# Patient Record
Sex: Female | Born: 1960 | Race: White | Hispanic: No | Smoking: Current every day smoker
Health system: Southern US, Community
[De-identification: ages and names within clinical notes are randomized; demographics above are authoritative.]

## PROBLEM LIST (undated history)

## (undated) DIAGNOSIS — J449 Chronic obstructive pulmonary disease, unspecified: Secondary | ICD-10-CM

## (undated) DIAGNOSIS — I10 Essential (primary) hypertension: Secondary | ICD-10-CM

## (undated) DIAGNOSIS — E1169 Type 2 diabetes mellitus with other specified complication: Secondary | ICD-10-CM

## (undated) DIAGNOSIS — G4733 Obstructive sleep apnea (adult) (pediatric): Secondary | ICD-10-CM

## (undated) DIAGNOSIS — E782 Mixed hyperlipidemia: Secondary | ICD-10-CM

## (undated) DIAGNOSIS — I700 Atherosclerosis of aorta: Secondary | ICD-10-CM

## (undated) HISTORY — DX: Obstructive sleep apnea (adult) (pediatric): G47.33

## (undated) HISTORY — DX: Essential (primary) hypertension: I10

## (undated) HISTORY — DX: Type 2 diabetes mellitus with other specified complication: E11.69

## (undated) HISTORY — DX: Mixed hyperlipidemia: E78.2

## (undated) HISTORY — DX: Chronic obstructive pulmonary disease, unspecified: J44.9

## (undated) HISTORY — DX: Atherosclerosis of aorta: I70.0

## (undated) HISTORY — PX: NO PAST SURGERIES: SHX2092

---

## 2013-02-03 LAB — HM PAP SMEAR: HM Pap smear: NORMAL

## 2016-12-09 LAB — COLOGUARD: Cologuard: NEGATIVE

## 2017-01-20 LAB — HM DIABETES EYE EXAM

## 2017-08-13 LAB — MICROALBUMIN, URINE: Microalb, Ur: NORMAL

## 2017-10-22 LAB — HM MAMMOGRAPHY: HM Mammogram: NORMAL (ref 0–4)

## 2018-02-26 LAB — HM DIABETES EYE EXAM

## 2019-01-10 LAB — HM MAMMOGRAPHY: HM Mammogram: NORMAL (ref 0–4)

## 2019-02-17 DIAGNOSIS — E782 Mixed hyperlipidemia: Secondary | ICD-10-CM | POA: Insufficient documentation

## 2019-02-17 DIAGNOSIS — I10 Essential (primary) hypertension: Secondary | ICD-10-CM | POA: Insufficient documentation

## 2019-02-17 DIAGNOSIS — E1169 Type 2 diabetes mellitus with other specified complication: Secondary | ICD-10-CM | POA: Insufficient documentation

## 2019-02-17 DIAGNOSIS — E1159 Type 2 diabetes mellitus with other circulatory complications: Secondary | ICD-10-CM

## 2019-02-17 DIAGNOSIS — I152 Hypertension secondary to endocrine disorders: Secondary | ICD-10-CM

## 2019-02-17 DIAGNOSIS — G4733 Obstructive sleep apnea (adult) (pediatric): Secondary | ICD-10-CM | POA: Insufficient documentation

## 2019-02-17 DIAGNOSIS — E118 Type 2 diabetes mellitus with unspecified complications: Secondary | ICD-10-CM | POA: Insufficient documentation

## 2019-02-17 HISTORY — DX: Hypertension secondary to endocrine disorders: I15.2

## 2019-02-17 HISTORY — DX: Type 2 diabetes mellitus with other circulatory complications: E11.59

## 2019-02-20 ENCOUNTER — Ambulatory Visit: Payer: BC Managed Care – PPO | Admitting: Nurse Practitioner

## 2019-02-20 ENCOUNTER — Other Ambulatory Visit: Payer: Self-pay

## 2019-02-20 ENCOUNTER — Encounter: Payer: Self-pay | Admitting: Nurse Practitioner

## 2019-02-20 VITALS — BP 132/74 | HR 78 | Temp 97.8°F | Ht 69.0 in | Wt 250.0 lb

## 2019-02-20 DIAGNOSIS — E669 Obesity, unspecified: Secondary | ICD-10-CM

## 2019-02-20 DIAGNOSIS — G4733 Obstructive sleep apnea (adult) (pediatric): Secondary | ICD-10-CM | POA: Diagnosis not present

## 2019-02-20 DIAGNOSIS — E1169 Type 2 diabetes mellitus with other specified complication: Secondary | ICD-10-CM

## 2019-02-20 DIAGNOSIS — E782 Mixed hyperlipidemia: Secondary | ICD-10-CM | POA: Diagnosis not present

## 2019-02-20 DIAGNOSIS — E1159 Type 2 diabetes mellitus with other circulatory complications: Secondary | ICD-10-CM

## 2019-02-20 DIAGNOSIS — I10 Essential (primary) hypertension: Secondary | ICD-10-CM

## 2019-02-20 NOTE — Patient Instructions (Addendum)
Patient will continue with diet and exercise modification. Foot care, f/u 6 months.   Diabetes Mellitus and Foot Care Foot care is an important part of your health, especially when you have diabetes. Diabetes may cause you to have problems because of poor blood flow (circulation) to your feet and legs, which can cause your skin to:  Become thinner and drier.  Break more easily.  Heal more slowly.  Peel and crack. You may also have nerve damage (neuropathy) in your legs and feet, causing decreased feeling in them. This means that you may not notice minor injuries to your feet that could lead to more serious problems. Noticing and addressing any potential problems early is the best way to prevent future foot problems. How to care for your feet Foot hygiene  Wash your feet daily with warm water and mild soap. Do not use hot water. Then, pat your feet and the areas between your toes until they are completely dry. Do not soak your feet as this can dry your skin.  Trim your toenails straight across. Do not dig under them or around the cuticle. File the edges of your nails with an emery board or nail file.  Apply a moisturizing lotion or petroleum jelly to the skin on your feet and to dry, brittle toenails. Use lotion that does not contain alcohol and is unscented. Do not apply lotion between your toes. Shoes and socks  Wear clean socks or stockings every day. Make sure they are not too tight. Do not wear knee-high stockings since they may decrease blood flow to your legs.  Wear shoes that fit properly and have enough cushioning. Always look in your shoes before you put them on to be sure there are no objects inside.  To break in new shoes, wear them for just a few hours a day. This prevents injuries on your feet. Wounds, scrapes, corns, and calluses  Check your feet daily for blisters, cuts, bruises, sores, and redness. If you cannot see the bottom of your feet, use a mirror or ask someone  for help.  Do not cut corns or calluses or try to remove them with medicine.  If you find a minor scrape, cut, or break in the skin on your feet, keep it and the skin around it clean and dry. You may clean these areas with mild soap and water. Do not clean the area with peroxide, alcohol, or iodine.  If you have a wound, scrape, corn, or callus on your foot, look at it several times a day to make sure it is healing and not infected. Check for: ? Redness, swelling, or pain. ? Fluid or blood. ? Warmth. ? Pus or a bad smell. General instructions  Do not cross your legs. This may decrease blood flow to your feet.  Do not use heating pads or hot water bottles on your feet. They may burn your skin. If you have lost feeling in your feet or legs, you may not know this is happening until it is too late.  Protect your feet from hot and cold by wearing shoes, such as at the beach or on hot pavement.  Schedule a complete foot exam at least once a year (annually) or more often if you have foot problems. If you have foot problems, report any cuts, sores, or bruises to your health care provider immediately. Contact a health care provider if:  You have a medical condition that increases your risk of infection and you have any  cuts, sores, or bruises on your feet.  You have an injury that is not healing.  You have redness on your legs or feet.  You feel burning or tingling in your legs or feet.  You have pain or cramps in your legs and feet.  Your legs or feet are numb.  Your feet always feel cold.  You have pain around a toenail. Get help right away if:  You have a wound, scrape, corn, or callus on your foot and: ? You have pain, swelling, or redness that gets worse. ? You have fluid or blood coming from the wound, scrape, corn, or callus. ? Your wound, scrape, corn, or callus feels warm to the touch. ? You have pus or a bad smell coming from the wound, scrape, corn, or callus. ? You have  a fever. ? You have a red line going up your leg. Summary  Check your feet every day for cuts, sores, red spots, swelling, and blisters.  Moisturize feet and legs daily.  Wear shoes that fit properly and have enough cushioning.  If you have foot problems, report any cuts, sores, or bruises to your health care provider immediately.  Schedule a complete foot exam at least once a year (annually) or more often if you have foot problems. This information is not intended to replace advice given to you by your health care provider. Make sure you discuss any questions you have with your health care provider. Document Revised: 09/14/2018 Document Reviewed: 01/24/2016 Elsevier Patient Education  [place].

## 2019-02-20 NOTE — Assessment & Plan Note (Signed)
patient is well managed on current plan. CPAP machine is working well. Tubing and mask checked by company and replaced with new ones.

## 2019-02-20 NOTE — Assessment & Plan Note (Signed)
Patient is well controlled on current plan, no changes to medication.

## 2019-02-20 NOTE — Progress Notes (Signed)
Established Patient Office Visit  Subjective:  Patient ID: Kathleen Wells, female    DOB: [birth year]  Age: 59 y.o. MRN: [id number]  CC: Patietnt  is a 59 years old female.She is here for 6 months f/u  The patient's medications were reviewed and reconciled since the patient's last visit.  History details were provided by the patient. The history appears to be reliable.   Chief Complaint  Patient presents with  . Follow-up    fasting  . Hypertension  . Hyperlipidemia    HPI Kathleen Wells presents for follow up of hypertension. Patient was diagnosed in 2015 The patient is tolerating the medication well without side effects. Compliance with treatment has been good; including taking medication as directed , maintains a healthy diet and regular exercise regimen , and following up as directed.  The patient presents with history of  type 2 diabetes mellitus without complications. Patient was diagnosed in 2015. Compliance with treatment has been good; the patient takes medication as directed , maintains a diabetic diet and an exercise regimen , follows up as directed , and is keeping a glucose diary. Sugars run 122-128 Patient specifically denies associated symptoms, including blurred vision, fatigue, polydipsia, polyphagia and polyuria . Patient denies hypoglycemia. In regard to preventative care, the patient performs foot self-exams daily and last ophthalmology exam was 2020  Mixed hyperlipidemia  Pt presents with hyperlipidemia. Patient was diagnosed in 2015. Compliance with treatment has been good; The patient is compliant with medications, maintains a low cholesterol diet , follows up as directed , and maintains an exercise regimen . The patient denies experiencing any hypercholesterolemia related symptoms.    Past Medical History:  Diagnosis Date  . Essential (primary) hypertension   . Mixed hyperlipidemia   . Obstructive sleep apnea (adult) (pediatric)   . Type 2 diabetes mellitus with  other specified complication ([place])     History reviewed. No pertinent surgical history.  Family History  Problem Relation Age of Onset  . Lung cancer Mother   . Congestive Heart Failure Father   . Lupus Sister   . Hepatitis C Brother   . Stomach cancer Maternal Grandmother     Social History   Socioeconomic History  . Marital status: Divorced    Spouse name: Not on file  . Number of children: Not on file  . Years of education: Not on file  . Highest education level: Not on file  Occupational History  . Occupation: OFFICE  Tobacco Use  . Smoking status: Current Every Day Smoker    Packs/day: 0.50    Types: Cigarettes  . Smokeless tobacco: Never Used  Substance and Sexual Activity  . Alcohol use: Not Currently  . Drug use: Never  . Sexual activity: Not on file  Other Topics Concern  . Not on file  Social History Narrative  . Not on file   Social Determinants of Health   Financial Resource Strain:   . Difficulty of Paying Living Expenses: Not on file  Food Insecurity:   . Worried About Charity fundraiser in the Last Year: Not on file  . Ran Out of Food in the Last Year: Not on file  Transportation Needs:   . Lack of Transportation (Medical): Not on file  . Lack of Transportation (Non-Medical): Not on file  Physical Activity:   . Days of Exercise per Week: Not on file  . Minutes of Exercise per Session: Not on file  Stress:   . Feeling of Stress :  Not on file  Social Connections:   . Frequency of Communication with Friends and Family: Not on file  . Frequency of Social Gatherings with Friends and Family: Not on file  . Attends Religious Services: Not on file  . Active Member of Clubs or Organizations: Not on file  . Attends Banker Meetings: Not on file  . Marital Status: Not on file  Intimate Partner Violence:   . Fear of Current or Ex-Partner: Not on file  . Emotionally Abused: Not on file  . Physically Abused: Not on file  . Sexually  Abused: Not on file    Outpatient Medications Prior to Visit  Medication Sig Dispense Refill  . aspirin 81 MG chewable tablet Chew 81 mg by mouth daily.    Marland Kitchen atorvastatin (LIPITOR) 20 MG tablet Take 20 mg by mouth daily.    Marland Kitchen olmesartan (BENICAR) 20 MG tablet Take 20 mg by mouth daily.     No facility-administered medications prior to visit.    Allergies  Allergen Reactions  . Lisinopril Diarrhea  . Septra [Sulfamethoxazole-Trimethoprim] Hives  . Sulfa Antibiotics Hives    ROS Review of Systems  Constitutional: Negative for activity change, appetite change, fatigue, fever and unexpected weight change.  HENT: Negative for congestion, ear discharge, ear pain, facial swelling, hearing loss, sinus pressure, sinus pain and voice change.   Eyes: Negative for pain and discharge.  Respiratory: Negative for cough, chest tightness and shortness of breath.   Cardiovascular: Negative for chest pain and palpitations.  Gastrointestinal: Negative.   Endocrine: Negative.   Genitourinary: Negative for difficulty urinating.  Musculoskeletal: Negative.   Skin: Positive for rash.  Neurological: Negative.   Psychiatric/Behavioral: Negative.       Objective:    Physical Exam  Constitutional: She appears well-developed and well-nourished. No distress.  HENT:  Head: Normocephalic.  Right Ear: External ear normal.  Left Ear: External ear normal.  Nose: Nose normal.  Mouth/Throat: Oropharynx is clear and moist. No oropharyngeal exudate.  Eyes: Pupils are equal, round, and reactive to light. Conjunctivae and EOM are normal. Right eye exhibits no discharge. Left eye exhibits no discharge.  Cardiovascular: Normal rate, regular rhythm, normal heart sounds and intact distal pulses.  Pulmonary/Chest: Effort normal and breath sounds normal. No respiratory distress. She exhibits no tenderness.  Abdominal: Soft. Bowel sounds are normal. She exhibits no distension. There is no abdominal tenderness.  There is no rebound.  Musculoskeletal:        General: No tenderness. Normal range of motion.     Cervical back: Normal range of motion and neck supple.  Neurological: She is alert.  Skin: Rash noted. She is not diaphoretic.  Psychiatric: She has a normal mood and affect. Her behavior is normal. Judgment normal.   Skin:  rash/dry skin under right foot, and left shoulder  BP 132/74 (BP Location: Left Arm, Patient Position: Sitting)   Pulse 78   Temp 97.8 F (36.6 C) (Temporal)   Ht 5\' 9"  (1.753 m)   Wt 250 lb (113.4 kg)   BMI 36.92 kg/m  Wt Readings from Last 3 Encounters:  02/20/19 250 lb (113.4 kg)     Health Maintenance Due  Topic Date Due  . Hepatitis C Screening  1960/02/15  . PNEUMOCOCCAL POLYSACCHARIDE VACCINE AGE 34-64 HIGH RISK  12/03/1962  . HIV Screening  12/03/1975  . OPHTHALMOLOGY EXAM  01/20/2018  . PAP SMEAR-Modifier  02/03/2018  . MAMMOGRAM  10/23/2018    There are no preventive care  reminders to display for this patient.  No results found for: TSH Lab Results  Component Value Date   WBC 9.6 02/20/2019   HGB 14.9 02/20/2019   HCT 42.4 02/20/2019   MCV 92 02/20/2019   PLT 330 02/20/2019   Lab Results  Component Value Date   NA 139 02/20/2019   K 4.4 02/20/2019   CO2 20 02/20/2019   GLUCOSE 109 (H) 02/20/2019   BUN 11 02/20/2019   CREATININE 0.62 02/20/2019   BILITOT 0.7 02/20/2019   ALKPHOS 120 (H) 02/20/2019   AST 23 02/20/2019   ALT 28 02/20/2019   PROT 6.9 02/20/2019   ALBUMIN 4.6 02/20/2019   CALCIUM 10.1 02/20/2019   Lab Results  Component Value Date   CHOL 174 02/20/2019   Lab Results  Component Value Date   HDL 65 02/20/2019   Lab Results  Component Value Date   LDLCALC 91 02/20/2019   Lab Results  Component Value Date   TRIG 101 02/20/2019   Lab Results  Component Value Date   CHOLHDL 2.7 02/20/2019   Lab Results  Component Value Date   HGBA1C 5.8 (H) 02/20/2019      Assessment & Plan:   Problem List Items  Addressed This Visit      Cardiovascular and Mediastinum   Essential (primary) hypertension    No changes to current regimen. Patient is well controlled.      Relevant Medications   aspirin 81 MG chewable tablet   olmesartan (BENICAR) 20 MG tablet   atorvastatin (LIPITOR) 20 MG tablet   Other Relevant Orders   Cologuard (Completed)     Respiratory   Obstructive sleep apnea (adult) (pediatric)    patient is well managed on current plan. CPAP machine is working well. Tubing and mask checked by company and replaced with new ones.        Endocrine   Type 2 diabetes mellitus with other specified complication [facility])    Patient is well controlled on current plan, no changes to medication.      Relevant Medications   aspirin 81 MG chewable tablet   olmesartan (BENICAR) 20 MG tablet   atorvastatin (LIPITOR) 20 MG tablet   RESOLVED: Diabetes mellitus without complication (HCC) - Primary   Relevant Medications   aspirin 81 MG chewable tablet   olmesartan (BENICAR) 20 MG tablet   atorvastatin (LIPITOR) 20 MG tablet   Other Relevant Orders   Microalbumin, urine (Completed)   HM DIABETES EYE EXAM (Completed)   CBC with Differential/Platelet (Completed)   Comprehensive metabolic panel (Completed)   Hemoglobin A1c (Completed)   Cardiovascular Risk Assessment (Completed)     Other   Mixed hyperlipidemia    Patient is well managed. No changes necessary to current medication regimen      Relevant Medications   aspirin 81 MG chewable tablet   olmesartan (BENICAR) 20 MG tablet   atorvastatin (LIPITOR) 20 MG tablet   Lipids abnormal    No changes to current medication, pt is well controlled. Continue diet and exercise      Relevant Medications   atorvastatin (LIPITOR) 20 MG tablet   Other Relevant Orders   Lipid Panel (Completed)      No orders of the defined types were placed in this encounter.   Follow-up: Return in about 6 months (around 08/20/2019).    Daryll Drown,  NP

## 2019-02-20 NOTE — Assessment & Plan Note (Addendum)
Patient is well managed. No changes necessary to current medication regimen

## 2019-02-20 NOTE — Assessment & Plan Note (Signed)
No changes to current regimen. Patient is well controlled.

## 2019-02-21 DIAGNOSIS — E119 Type 2 diabetes mellitus without complications: Secondary | ICD-10-CM | POA: Insufficient documentation

## 2019-02-21 DIAGNOSIS — E7889 Other lipoprotein metabolism disorders: Secondary | ICD-10-CM | POA: Insufficient documentation

## 2019-02-21 LAB — COMPREHENSIVE METABOLIC PANEL
ALT: 28 IU/L (ref 0–32)
AST: 23 IU/L (ref 0–40)
Albumin/Globulin Ratio: 2 (ref 1.2–2.2)
Albumin: 4.6 g/dL (ref 3.8–4.9)
Alkaline Phosphatase: 120 IU/L — ABNORMAL HIGH (ref 39–117)
BUN/Creatinine Ratio: 18 (ref 9–23)
BUN: 11 mg/dL (ref 6–24)
Bilirubin Total: 0.7 mg/dL (ref 0.0–1.2)
CO2: 20 mmol/L (ref 20–29)
Calcium: 10.1 mg/dL (ref 8.7–10.2)
Chloride: 103 mmol/L (ref 96–106)
Creatinine, Ser: 0.62 mg/dL (ref 0.57–1.00)
GFR calc Af Amer: 115 mL/min/{1.73_m2} (ref >59)
GFR calc non Af Amer: 100 mL/min/{1.73_m2} (ref >59)
Globulin, Total: 2.3 g/dL (ref 1.5–4.5)
Glucose: 109 mg/dL — ABNORMAL HIGH (ref 65–99)
Potassium: 4.4 mmol/L (ref 3.5–5.2)
Sodium: 139 mmol/L (ref 134–144)
Total Protein: 6.9 g/dL (ref 6.0–8.5)

## 2019-02-21 LAB — LIPID PANEL
Chol/HDL Ratio: 2.7 ratio (ref 0.0–4.4)
Cholesterol, Total: 174 mg/dL (ref 100–199)
HDL: 65 mg/dL (ref >39)
LDL Chol Calc (NIH): 91 mg/dL (ref 0–99)
Triglycerides: 101 mg/dL (ref 0–149)
VLDL Cholesterol Cal: 18 mg/dL (ref 5–40)

## 2019-02-21 LAB — HEMOGLOBIN A1C
Est. average glucose Bld gHb Est-mCnc: 120 mg/dL
Hgb A1c MFr Bld: 5.8 % — ABNORMAL HIGH (ref 4.8–5.6)

## 2019-02-21 LAB — CBC WITH DIFFERENTIAL/PLATELET
Basophils Absolute: 0 10*3/uL (ref 0.0–0.2)
Basos: 0 %
EOS (ABSOLUTE): 0.2 10*3/uL (ref 0.0–0.4)
Eos: 2 %
Hematocrit: 42.4 % (ref 34.0–46.6)
Hemoglobin: 14.9 g/dL (ref 11.1–15.9)
Immature Grans (Abs): 0 10*3/uL (ref 0.0–0.1)
Immature Granulocytes: 0 %
Lymphocytes Absolute: 3 10*3/uL (ref 0.7–3.1)
Lymphs: 31 %
MCH: 32.3 pg (ref 26.6–33.0)
MCHC: 35.1 g/dL (ref 31.5–35.7)
MCV: 92 fL (ref 79–97)
Monocytes Absolute: 0.7 10*3/uL (ref 0.1–0.9)
Monocytes: 7 %
Neutrophils Absolute: 5.7 10*3/uL (ref 1.4–7.0)
Neutrophils: 60 %
Platelets: 330 10*3/uL (ref 150–450)
RBC: 4.61 x10E6/uL (ref 3.77–5.28)
RDW: 11.9 % (ref 11.7–15.4)
WBC: 9.6 10*3/uL (ref 3.4–10.8)

## 2019-02-21 LAB — CARDIOVASCULAR RISK ASSESSMENT

## 2019-02-21 NOTE — Assessment & Plan Note (Signed)
No changes to current medication, pt is well controlled. Continue diet and exercise

## 2019-03-07 ENCOUNTER — Other Ambulatory Visit: Payer: Self-pay

## 2019-03-07 DIAGNOSIS — I10 Essential (primary) hypertension: Secondary | ICD-10-CM

## 2019-03-07 DIAGNOSIS — E7889 Other lipoprotein metabolism disorders: Secondary | ICD-10-CM

## 2019-03-07 MED ORDER — OLMESARTAN MEDOXOMIL 20 MG PO TABS: 20.0000 mg | ORAL_TABLET | Freq: Every day | ORAL | 0 refills | Status: DC

## 2019-03-07 MED ORDER — ATORVASTATIN CALCIUM 20 MG PO TABS: 20.0000 mg | ORAL_TABLET | Freq: Every day | ORAL | 0 refills | Status: DC

## 2019-06-30 ENCOUNTER — Other Ambulatory Visit: Payer: Self-pay

## 2019-06-30 DIAGNOSIS — E7889 Other lipoprotein metabolism disorders: Secondary | ICD-10-CM

## 2019-07-02 MED ORDER — ATORVASTATIN CALCIUM 20 MG PO TABS: 20.0000 mg | ORAL_TABLET | Freq: Every day | ORAL | 0 refills | Status: DC

## 2019-07-03 ENCOUNTER — Other Ambulatory Visit: Payer: Self-pay

## 2019-07-03 DIAGNOSIS — E7889 Other lipoprotein metabolism disorders: Secondary | ICD-10-CM

## 2019-07-03 DIAGNOSIS — I10 Essential (primary) hypertension: Secondary | ICD-10-CM

## 2019-07-03 MED ORDER — ATORVASTATIN CALCIUM 20 MG PO TABS: 20.0000 mg | ORAL_TABLET | Freq: Every day | ORAL | 0 refills | Status: DC

## 2019-07-03 MED ORDER — OLMESARTAN MEDOXOMIL 20 MG PO TABS: 20.0000 mg | ORAL_TABLET | Freq: Every day | ORAL | 0 refills | Status: DC

## 2019-08-22 ENCOUNTER — Encounter: Payer: Self-pay | Admitting: Family Medicine

## 2019-08-22 ENCOUNTER — Ambulatory Visit: Payer: BC Managed Care – PPO | Admitting: Family Medicine

## 2019-08-22 ENCOUNTER — Ambulatory Visit: Payer: BC Managed Care – PPO | Admitting: Nurse Practitioner

## 2019-08-22 ENCOUNTER — Other Ambulatory Visit: Payer: Self-pay

## 2019-08-22 VITALS — BP 128/80 | HR 77 | Temp 97.3°F | Ht 69.0 in | Wt 244.0 lb

## 2019-08-22 DIAGNOSIS — E1169 Type 2 diabetes mellitus with other specified complication: Secondary | ICD-10-CM | POA: Diagnosis not present

## 2019-08-22 DIAGNOSIS — I10 Essential (primary) hypertension: Secondary | ICD-10-CM

## 2019-08-22 DIAGNOSIS — Z794 Long term (current) use of insulin: Secondary | ICD-10-CM

## 2019-08-22 DIAGNOSIS — G4733 Obstructive sleep apnea (adult) (pediatric): Secondary | ICD-10-CM | POA: Diagnosis not present

## 2019-08-22 DIAGNOSIS — E782 Mixed hyperlipidemia: Secondary | ICD-10-CM | POA: Diagnosis not present

## 2019-08-22 DIAGNOSIS — Z23 Encounter for immunization: Secondary | ICD-10-CM | POA: Diagnosis not present

## 2019-08-22 LAB — POCT UA - MICROALBUMIN: Microalbumin Ur, POC: 10 mg/L

## 2019-08-22 NOTE — Patient Instructions (Addendum)
Recommendations: Quit smoking. Exercise more.  Continue to work on weight loss.  Continue to eat healthy.

## 2019-08-22 NOTE — Progress Notes (Addendum)
Established Patient Office Visit  Subjective:  Patient ID: Kathleen Wells, female    DOB: [birth year]  Age: 59 y.o. MRN: [id number]  CC: Patietnt  is a 59 years old female.She is here for 6 months f/u  The patient's medications were reviewed and reconciled since the patient's last visit.  History details were provided by the patient. The history appears to be reliable.   Chief Complaint  Patient presents with  . Hypertension  . Hyperlipidemia    HPI [name] presents for follow up of hypertension. Patient was diagnosed in 2015 The patient is tolerating the medication well without side effects. Compliance with treatment has been good; including taking medication as directed , maintains a healthy diet and regular exercise regimen , and following up as directed.  The patient presents with history of  type 2 diabetes mellitus without complications. Patient was diagnosed in 2015. Was prediabetic for years.Compliance with treatment has been good; the patient takes medication as directed , maintains a diabetic diet, not exercising.  Patient specifically denies associated symptoms, including blurred vision, fatigue, polyphagia and polyuria . Patient denies hypoglycemia. In regard to preventative care, the patient performs foot self-exams daily and last ophthalmology exam was 10/27/2018.  Mixed hyperlipidemia  Pt presents with hyperlipidemia. Patient was diagnosed in 2015. Compliance with treatment has been good; The patient is compliant with medications (lipitor), maintains a low cholesterol diet , follows up as directed , and maintains an exercise regimen . The patient denies experiencing any hypercholesterolemia related symptoms.    Past Medical History:  Diagnosis Date  . Essential (primary) hypertension   . Mixed hyperlipidemia   . Obstructive sleep apnea (adult) (pediatric)   . Type 2 diabetes mellitus with other specified complication (HCC)     History reviewed. No pertinent surgical  history.  Family History  Problem Relation Age of Onset  . Lung cancer Mother   . Congestive Heart Failure Father   . Lupus Sister   . Hepatitis C Brother   . Stomach cancer Maternal Grandmother     Social History   Socioeconomic History  . Marital status: Divorced    Spouse name: Not on file  . Number of children: Not on file  . Years of education: Not on file  . Highest education level: Not on file  Occupational History  . Occupation: OFFICE  Tobacco Use  . Smoking status: Current Every Day Smoker    Packs/day: 0.50    Types: Cigarettes  . Smokeless tobacco: Never Used  Vaping Use  . Vaping Use: Never used  Substance and Sexual Activity  . Alcohol use: Not Currently  . Drug use: Never  . Sexual activity: Not on file  Other Topics Concern  . Not on file  Social History Narrative  . Not on file   Social Determinants of Health   Financial Resource Strain:   . Difficulty of Paying Living Expenses: Not on file  Food Insecurity:   . Worried About Programme researcher, broadcasting/film/video in the Last Year: Not on file  . Ran Out of Food in the Last Year: Not on file  Transportation Needs:   . Lack of Transportation (Medical): Not on file  . Lack of Transportation (Non-Medical): Not on file  Physical Activity:   . Days of Exercise per Week: Not on file  . Minutes of Exercise per Session: Not on file  Stress:   . Feeling of Stress : Not on file  Social Connections:   . Frequency of  Communication with Friends and Family: Not on file  . Frequency of Social Gatherings with Friends and Family: Not on file  . Attends Religious Services: Not on file  . Active Member of Clubs or Organizations: Not on file  . Attends Banker Meetings: Not on file  . Marital Status: Not on file  Intimate Partner Violence:   . Fear of Current or Ex-Partner: Not on file  . Emotionally Abused: Not on file  . Physically Abused: Not on file  . Sexually Abused: Not on file    Outpatient Medications  Prior to Visit  Medication Sig Dispense Refill  . aspirin 81 MG chewable tablet Chew 81 mg by mouth daily.    Marland Kitchen atorvastatin (LIPITOR) 20 MG tablet Take 1 tablet (20 mg total) by mouth daily. 90 tablet 0  . olmesartan (BENICAR) 20 MG tablet Take 1 tablet (20 mg total) by mouth daily. 90 tablet 0   No facility-administered medications prior to visit.    Allergies  Allergen Reactions  . Lisinopril Diarrhea  . Septra [Sulfamethoxazole-Trimethoprim] Hives  . Sulfa Antibiotics Hives    ROS Review of Systems  Constitutional: Negative for chills and fever.  HENT: Positive for congestion. Negative for ear pain, rhinorrhea and sore throat.   Respiratory: Negative for cough and shortness of breath.   Cardiovascular: Negative for chest pain and palpitations.  Gastrointestinal: Negative for abdominal pain, constipation, diarrhea, nausea and vomiting.  Genitourinary: Negative for dysuria and urgency.  Musculoskeletal: Negative for back pain and myalgias.  Skin: Positive for rash.  Neurological: Negative for dizziness, weakness, light-headedness and headaches.  Psychiatric/Behavioral: Negative for dysphoric mood. The patient is not nervous/anxious.       Objective:    Physical Exam Constitutional:      General: She is not in acute distress.    Appearance: She is well-developed. She is not diaphoretic.  Eyes:     Conjunctiva/sclera: Conjunctivae normal.     Pupils: Pupils are equal, round, and reactive to light.  Cardiovascular:     Rate and Rhythm: Normal rate and regular rhythm.     Heart sounds: Normal heart sounds.  Pulmonary:     Effort: Pulmonary effort is normal.     Breath sounds: Normal breath sounds.  Abdominal:     General: Bowel sounds are normal.     Palpations: Abdomen is soft.  Musculoskeletal:        General: No tenderness. Normal range of motion.     Cervical back: Normal range of motion and neck supple.  Neurological:     Mental Status: She is alert.    Psychiatric:        Behavior: Behavior normal.        Judgment: Judgment normal.    Skin:  rash/dry skin under right foot, and left shoulder  BP 128/80   Pulse 77   Temp (!) 97.3 F (36.3 C)   Ht 5\' 9"  (1.753 m)   Wt 244 lb (110.7 kg)   SpO2 98%   BMI 36.03 kg/m  Wt Readings from Last 3 Encounters:  08/22/19 244 lb (110.7 kg)  02/20/19 250 lb (113.4 kg)     Health Maintenance Due  Topic Date Due  . Hepatitis C Screening  Never done  . HIV Screening  Never done  . PAP SMEAR-Modifier  02/03/2018  . OPHTHALMOLOGY EXAM  02/27/2019  . INFLUENZA VACCINE  08/06/2019    There are no preventive care reminders to display for this patient.  No  results found for: TSH Lab Results  Component Value Date   WBC 10.2 08/22/2019   HGB 14.7 08/22/2019   HCT 42.2 08/22/2019   MCV 93 08/22/2019   PLT 312 08/22/2019   Lab Results  Component Value Date   NA 139 08/22/2019   K 4.6 08/22/2019   CO2 21 08/22/2019   GLUCOSE 110 (H) 08/22/2019   BUN 13 08/22/2019   CREATININE 0.61 08/22/2019   BILITOT 0.6 08/22/2019   ALKPHOS 108 08/22/2019   AST 22 08/22/2019   ALT 26 08/22/2019   PROT 6.9 08/22/2019   ALBUMIN 4.7 08/22/2019   CALCIUM 10.0 08/22/2019   Lab Results  Component Value Date   CHOL 166 08/22/2019   Lab Results  Component Value Date   HDL 61 08/22/2019   Lab Results  Component Value Date   LDLCALC 87 08/22/2019   Lab Results  Component Value Date   TRIG 97 08/22/2019   Lab Results  Component Value Date   CHOLHDL 2.7 08/22/2019   Lab Results  Component Value Date   HGBA1C 6.0 (H) 08/22/2019      Assessment & Plan:  1. Essential (primary) hypertension Well controlled.  No changes to medicines.  Continue to work on eating a healthy diet and exercise.  Labs drawn today.   2. Type 2 diabetes mellitus with other specified complication, with long-term current use of insulin (HCC) Control: good. Recommend check feet daily. Recommend annual eye  exams. Medicines: no changes Continue to work on eating a healthy diet and exercise.  Labs drawn today.   - Hemoglobin A1c - POCT UA - Microalbumin  3. Mixed hyperlipidemia Well controlled.  No changes to medicines.  Continue to work on eating a healthy diet and exercise.  Labs drawn today.  - CBC with Differential/Platelet - Comprehensive metabolic panel - Lipid panel  4. Obstructive sleep apnea (adult) (pediatric) Recommend weight loss.  5. Need for prophylactic vaccination against Streptococcus pneumoniae (pneumococcus) - Pneumococcal polysaccharide vaccine 23-valent greater than or equal to 2yo subcutaneous/IM  Follow-up: Return in about 3 months (around 11/22/2019) for physical this fall.Blane Ohara, MD

## 2019-08-23 LAB — LIPID PANEL
Chol/HDL Ratio: 2.7 ratio (ref 0.0–4.4)
Cholesterol, Total: 166 mg/dL (ref 100–199)
HDL: 61 mg/dL (ref >39)
LDL Chol Calc (NIH): 87 mg/dL (ref 0–99)
Triglycerides: 97 mg/dL (ref 0–149)
VLDL Cholesterol Cal: 18 mg/dL (ref 5–40)

## 2019-08-23 LAB — COMPREHENSIVE METABOLIC PANEL
ALT: 26 IU/L (ref 0–32)
AST: 22 IU/L (ref 0–40)
Albumin/Globulin Ratio: 2.1 (ref 1.2–2.2)
Albumin: 4.7 g/dL (ref 3.8–4.9)
Alkaline Phosphatase: 108 IU/L (ref 48–121)
BUN/Creatinine Ratio: 21 (ref 9–23)
BUN: 13 mg/dL (ref 6–24)
Bilirubin Total: 0.6 mg/dL (ref 0.0–1.2)
CO2: 21 mmol/L (ref 20–29)
Calcium: 10 mg/dL (ref 8.7–10.2)
Chloride: 104 mmol/L (ref 96–106)
Creatinine, Ser: 0.61 mg/dL (ref 0.57–1.00)
GFR calc Af Amer: 116 mL/min/{1.73_m2} (ref >59)
GFR calc non Af Amer: 100 mL/min/{1.73_m2} (ref >59)
Globulin, Total: 2.2 g/dL (ref 1.5–4.5)
Glucose: 110 mg/dL — ABNORMAL HIGH (ref 65–99)
Potassium: 4.6 mmol/L (ref 3.5–5.2)
Sodium: 139 mmol/L (ref 134–144)
Total Protein: 6.9 g/dL (ref 6.0–8.5)

## 2019-08-23 LAB — CBC WITH DIFFERENTIAL/PLATELET
Basophils Absolute: 0.1 10*3/uL (ref 0.0–0.2)
Basos: 1 %
EOS (ABSOLUTE): 0.3 10*3/uL (ref 0.0–0.4)
Eos: 3 %
Hematocrit: 42.2 % (ref 34.0–46.6)
Hemoglobin: 14.7 g/dL (ref 11.1–15.9)
Immature Grans (Abs): 0.1 10*3/uL (ref 0.0–0.1)
Immature Granulocytes: 1 %
Lymphocytes Absolute: 2.7 10*3/uL (ref 0.7–3.1)
Lymphs: 27 %
MCH: 32.4 pg (ref 26.6–33.0)
MCHC: 34.8 g/dL (ref 31.5–35.7)
MCV: 93 fL (ref 79–97)
Monocytes Absolute: 0.6 10*3/uL (ref 0.1–0.9)
Monocytes: 6 %
Neutrophils Absolute: 6.5 10*3/uL (ref 1.4–7.0)
Neutrophils: 62 %
Platelets: 312 10*3/uL (ref 150–450)
RBC: 4.54 x10E6/uL (ref 3.77–5.28)
RDW: 12.5 % (ref 11.7–15.4)
WBC: 10.2 10*3/uL (ref 3.4–10.8)

## 2019-08-23 LAB — HEMOGLOBIN A1C
Est. average glucose Bld gHb Est-mCnc: 126 mg/dL
Hgb A1c MFr Bld: 6 % — ABNORMAL HIGH (ref 4.8–5.6)

## 2019-08-23 LAB — CARDIOVASCULAR RISK ASSESSMENT

## 2019-09-26 ENCOUNTER — Other Ambulatory Visit: Payer: Self-pay | Admitting: Physician Assistant

## 2019-09-26 DIAGNOSIS — I10 Essential (primary) hypertension: Secondary | ICD-10-CM

## 2019-11-22 ENCOUNTER — Encounter: Payer: Self-pay | Admitting: Family Medicine

## 2019-11-22 ENCOUNTER — Other Ambulatory Visit: Payer: Self-pay

## 2019-11-22 ENCOUNTER — Ambulatory Visit (INDEPENDENT_AMBULATORY_CARE_PROVIDER_SITE_OTHER): Payer: BC Managed Care – PPO | Admitting: Family Medicine

## 2019-11-22 VITALS — BP 110/60 | HR 104 | Temp 97.2°F | Ht 69.0 in | Wt 243.6 lb

## 2019-11-22 DIAGNOSIS — Z0001 Encounter for general adult medical examination with abnormal findings: Secondary | ICD-10-CM

## 2019-11-22 DIAGNOSIS — Z122 Encounter for screening for malignant neoplasm of respiratory organs: Secondary | ICD-10-CM

## 2019-11-22 DIAGNOSIS — Z1211 Encounter for screening for malignant neoplasm of colon: Secondary | ICD-10-CM | POA: Diagnosis not present

## 2019-11-22 DIAGNOSIS — Z124 Encounter for screening for malignant neoplasm of cervix: Secondary | ICD-10-CM

## 2019-11-22 DIAGNOSIS — Z1231 Encounter for screening mammogram for malignant neoplasm of breast: Secondary | ICD-10-CM

## 2019-11-22 DIAGNOSIS — I83899 Varicose veins of unspecified lower extremities with other complications: Secondary | ICD-10-CM

## 2019-11-22 DIAGNOSIS — F17200 Nicotine dependence, unspecified, uncomplicated: Secondary | ICD-10-CM

## 2019-11-22 DIAGNOSIS — F17218 Nicotine dependence, cigarettes, with other nicotine-induced disorders: Secondary | ICD-10-CM

## 2019-11-22 HISTORY — DX: Nicotine dependence, unspecified, uncomplicated: F17.200

## 2019-11-22 NOTE — Progress Notes (Signed)
Subjective:  Patient ID: Kathleen Wells, female    DOB: Feb 03, 1960  Age: 59 y.o. MRN: [id number]  Chief Complaint  Patient presents with  . Annual Exam    HPI Well Adult Physical: Patient here for a comprehensive physical exam.  Encounter for general adult medical examination without abnormal findings  Physical ("At Risk" items are starred): Patient's last physical exam was 1 year ago .  Smoking: 1/2 - 1 ppd for 59 yo.   Physical Activity: Exercises at least 3 times per week-walks 20 minutes  Alcohol/Drug Use: Is a social-drinker ; No illicit drug use ;  Patient is not afflicted from Stress Incontinence and Urge Incontinence  Safety: reviewed. Patient wears a seat belt, has smoke detectors, has carbon monoxide detectors, practice and wears sunscreen with extended sun exposure.  Dental Care: biannual cleanings, brushes and flosses daily. Ophthalmology/Optometry: Annual visit.  Hearing loss: none Vision impairments: wears glasses  Menarche: 13 Menstrual History: were regular. Menopause: 59 yo. Pregnancy history: none Safe at home: yes Self breast exams: yes  Mammogram 01/10/2019    Office Visit from 08/22/2019 in [facility]  PHQ-2 Total Score 0        Social Hx   Social History   Socioeconomic History  . Marital status: Media planner    Spouse name: Not on file  . Number of children: 0  . Years of education: Not on file  . Highest education level: Not on file  Occupational History  . Occupation: OFFICE  Tobacco Use  . Smoking status: Current Every Day Smoker    Packs/day: 0.50    Years: 42.00    Pack years: 21.00    Types: Cigarettes  . Smokeless tobacco: Never Used  Vaping Use  . Vaping Use: Never used  Substance and Sexual Activity  . Alcohol use: Yes    Alcohol/week: 8.0 standard drinks    Types: 4 Cans of beer, 4 Standard drinks or equivalent per week  . Drug use: Never  . Sexual activity: Not on file  Other Topics Concern  . Not on file   Social History Narrative  . Not on file   Social Determinants of Health   Financial Resource Strain:   . Difficulty of Paying Living Expenses: Not on file  Food Insecurity:   . Worried About Programme researcher, broadcasting/film/video in the Last Year: Not on file  . Ran Out of Food in the Last Year: Not on file  Transportation Needs:   . Lack of Transportation (Medical): Not on file  . Lack of Transportation (Non-Medical): Not on file  Physical Activity:   . Days of Exercise per Week: Not on file  . Minutes of Exercise per Session: Not on file  Stress:   . Feeling of Stress : Not on file  Social Connections:   . Frequency of Communication with Friends and Family: Not on file  . Frequency of Social Gatherings with Friends and Family: Not on file  . Attends Religious Services: Not on file  . Active Member of Clubs or Organizations: Not on file  . Attends Banker Meetings: Not on file  . Marital Status: Not on file   Past Medical History:  Diagnosis Date  . Essential (primary) hypertension   . Mixed hyperlipidemia   . Obstructive sleep apnea (adult) (pediatric)   . Type 2 diabetes mellitus with other specified complication (HCC)    History reviewed. No pertinent surgical history.  Family History  Problem Relation Age of  Onset  . Lung cancer Mother   . Congestive Heart Failure Father   . Lupus Sister   . Hepatitis C Brother   . Stomach cancer Maternal Grandmother     Review of Systems  Constitutional: Negative for chills, fatigue and fever.  HENT: Negative for congestion, ear pain and sore throat.   Respiratory: Negative for cough and shortness of breath.   Cardiovascular: Negative for chest pain.  Gastrointestinal: Negative for abdominal pain, constipation, diarrhea, nausea and vomiting.  Genitourinary: Negative for dysuria and urgency.  Musculoskeletal: Negative for arthralgias and myalgias.  Skin: Negative for rash.  Neurological: Negative for dizziness and headaches.    Psychiatric/Behavioral: Negative for dysphoric mood. The patient is not nervous/anxious.     Objective:  BP 110/60   Pulse (!) 104   Temp (!) 97.2 F (36.2 C)   Ht 5\' 9"  (1.753 m)   Wt 243 lb 9.6 oz (110.5 kg)   SpO2 99%   BMI 35.97 kg/m   BP/Weight 11/22/2019 08/22/2019 02/20/2019  Systolic BP 110 128 132  Diastolic BP 60 80 74  Wt. (Lbs) 243.6 244 250  BMI 35.97 36.03 36.92    Physical Exam Vitals reviewed.  Constitutional:      General: She is not in acute distress.    Appearance: Normal appearance. She is obese.  HENT:     Right Ear: Tympanic membrane and ear canal normal.     Left Ear: Tympanic membrane and ear canal normal.     Nose: Nose normal. No congestion or rhinorrhea.  Eyes:     Conjunctiva/sclera: Conjunctivae normal.  Neck:     Thyroid: No thyroid mass.  Cardiovascular:     Rate and Rhythm: Normal rate and regular rhythm.     Pulses: Normal pulses.     Heart sounds: No murmur heard.   Pulmonary:     Effort: Pulmonary effort is normal.     Breath sounds: Normal breath sounds.  Chest:     Breasts:        Right: Normal.        Left: Normal.  Abdominal:     General: Bowel sounds are normal.     Palpations: Abdomen is soft. There is no mass.     Tenderness: There is no abdominal tenderness.  Genitourinary:    General: Normal vulva.     Vagina: No vaginal discharge.     Comments: Pap sent.  Musculoskeletal:        General: Normal range of motion.  Lymphadenopathy:     Cervical: No cervical adenopathy.     Upper Body:     Right upper body: No axillary adenopathy.     Left upper body: No axillary adenopathy.  Skin:    General: Skin is warm and dry.  Neurological:     Mental Status: She is alert and oriented to person, place, and time.     Cranial Nerves: No cranial nerve deficit.  Psychiatric:        Mood and Affect: Mood normal.        Behavior: Behavior normal.     Lab Results  Component Value Date   WBC 10.2 08/22/2019   HGB 14.7  08/22/2019   HCT 42.2 08/22/2019   PLT 312 08/22/2019   GLUCOSE 110 (H) 08/22/2019   CHOL 166 08/22/2019   TRIG 97 08/22/2019   HDL 61 08/22/2019   LDLCALC 87 08/22/2019   ALT 26 08/22/2019   AST 22 08/22/2019   NA 139  08/22/2019   K 4.6 08/22/2019   CL 104 08/22/2019   CREATININE 0.61 08/22/2019   BUN 13 08/22/2019   CO2 21 08/22/2019   HGBA1C 6.0 (H) 08/22/2019   MICROALBUR 10 08/22/2019      Assessment & Plan:  1. Encounter for general adult medical examination with abnormal findings  Healthy female.   Recommend continue to work on eating healthy diet and exercise. Ordering Mammogram, cologuard test, low contrast ct scan of lungs for lung cancer screening.  Check on shingles vaccine with insurance.  Referring to vascular and vein doctor.   2. Cervical cancer screening  - IGP, Aptima HPV, rfx 16/18,45  3. Morbid obesity (HCC) - bmi 35 with 2 serious comorbidities  Recommend continue to work on eating healthy diet and exercise.  4. Screen for colon cancer - Ambulatory referral to Gastroenterology  5. Encounter for screening mammogram for malignant neoplasm of breast - Mammogram   6. Cigarette nicotine dependence with other nicotine-induced disorder - Education given. Chantix, nicoderm patches, and wellbutrin discussed.   7. Symptomatic spider varicose vein - Ambulatory referral to Vascular Surgery  8. Screening for lung cancer - CT CHEST LUNG CANCER SCREENING LOW DOSE WO CONTRAST   Body mass index is 35.97 kg/m.    This is a list of the screening recommended for you and due dates:  Health Maintenance  Topic Date Due  .  Hepatitis C: One time screening is recommended by Center for Disease Control  (CDC) for  adults born from [birth year] through 1965.   Never done  . HIV Screening  Never done  . Pap Smear  02/03/2018  . Eye exam for diabetics  02/27/2019  . Flu Shot  08/06/2019  . Cologuard (Stool DNA test)  12/10/2019  . Mammogram  01/10/2020  . Complete foot  exam   02/20/2020  . Hemoglobin A1C  02/22/2020  . Tetanus Vaccine  03/11/2024  . Pneumococcal vaccine  Completed  . COVID-19 Vaccine  Completed     AN INDIVIDUALIZED CARE PLAN: was established or reinforced today.   SELF MANAGEMENT: The patient and I together assessed ways to personally work towards obtaining the recommended goals  Support needs The patient and/or family needs were assessed and services were offered if appropriate.  Follow-up: Return in about 3 months (around 02/22/2020) for fasting. CPE in 1 yr. .  An After Visit Summary was printed and given to the patient.  Blane Ohara, MD [facility] [phone]

## 2019-11-22 NOTE — Patient Instructions (Addendum)
Ordering Mammogram, cologuard test, low contrast ct scan of lungs for lung cancer screening.  Check on shingles vaccine with insurance.  Referring to vascular and vein doctor.   Preventive Care 58-59 Years Old, Female Preventive care refers to visits with your health care provider and lifestyle choices that can promote health and wellness. This includes:  A yearly physical exam. This may also be called an annual well check.  Regular dental visits and eye exams.  Immunizations.  Screening for certain conditions.  Healthy lifestyle choices, such as eating a healthy diet, getting regular exercise, not using drugs or products that contain nicotine and tobacco, and limiting alcohol use. What can I expect for my preventive care visit? Physical exam Your health care provider will check your:  Height and weight. This may be used to calculate body mass index (BMI), which tells if you are at a healthy weight.  Heart rate and blood pressure.  Skin for abnormal spots. Counseling Your health care provider may ask you questions about your:  Alcohol, tobacco, and drug use.  Emotional well-being.  Home and relationship well-being.  Sexual activity.  Eating habits.  Work and work Statistician.  Method of birth control.  Menstrual cycle.  Pregnancy history. What immunizations do I need?  Influenza (flu) vaccine  This is recommended every year. Tetanus, diphtheria, and pertussis (Tdap) vaccine  You may need a Td booster every 10 years. Varicella (chickenpox) vaccine  You may need this if you have not been vaccinated. Zoster (shingles) vaccine  You may need this after age 32. Measles, mumps, and rubella (MMR) vaccine  You may need at least one dose of MMR if you were born in 1957 or later. You may also need a second dose. Pneumococcal conjugate (PCV13) vaccine  You may need this if you have certain conditions and were not previously vaccinated. Pneumococcal polysaccharide  (PPSV23) vaccine  You may need one or two doses if you smoke cigarettes or if you have certain conditions. Meningococcal conjugate (MenACWY) vaccine  You may need this if you have certain conditions. Hepatitis A vaccine  You may need this if you have certain conditions or if you travel or work in places where you may be exposed to hepatitis A. Hepatitis B vaccine  You may need this if you have certain conditions or if you travel or work in places where you may be exposed to hepatitis B. Haemophilus influenzae type b (Hib) vaccine  You may need this if you have certain conditions. Human papillomavirus (HPV) vaccine  If recommended by your health care provider, you may need three doses over 6 months. You may receive vaccines as individual doses or as more than one vaccine together in one shot (combination vaccines). Talk with your health care provider about the risks and benefits of combination vaccines. What tests do I need? Blood tests  Lipid and cholesterol levels. These may be checked every 5 years, or more frequently if you are over 39 years old.  Hepatitis C test.  Hepatitis B test. Screening  Lung cancer screening. You may have this screening every year starting at age 27 if you have a 30-pack-year history of smoking and currently smoke or have quit within the past 15 years.  Colorectal cancer screening. All adults should have this screening starting at age 63 and continuing until age 67. Your health care provider may recommend screening at age 15 if you are at increased risk. You will have tests every 1-10 years, depending on your results and  the type of screening test.  Diabetes screening. This is done by checking your blood sugar (glucose) after you have not eaten for a while (fasting). You may have this done every 1-3 years.  Mammogram. This may be done every 1-2 years. Talk with your health care provider about when you should start having regular mammograms. This may  depend on whether you have a family history of breast cancer.  BRCA-related cancer screening. This may be done if you have a family history of breast, ovarian, tubal, or peritoneal cancers.  Pelvic exam and Pap test. This may be done every 3 years starting at age 71. Starting at age 46, this may be done every 5 years if you have a Pap test in combination with an HPV test. Other tests  Sexually transmitted disease (STD) testing.  Bone density scan. This is done to screen for osteoporosis. You may have this scan if you are at high risk for osteoporosis. Follow these instructions at home: Eating and drinking  Eat a diet that includes fresh fruits and vegetables, whole grains, lean protein, and low-fat dairy.  Take vitamin and mineral supplements as recommended by your health care provider.  Do not drink alcohol if: ? Your health care provider tells you not to drink. ? You are pregnant, may be pregnant, or are planning to become pregnant.  If you drink alcohol: ? Limit how much you have to 0-1 drink a day. ? Be aware of how much alcohol is in your drink. In the [place] one drink equals one 12 oz bottle of beer (355 mL), one 5 oz glass of wine (148 mL), or one 1 oz glass of hard liquor (44 mL). Lifestyle  Take daily care of your teeth and gums.  Stay active. Exercise for at least 30 minutes on 5 or more days each week.  Do not use any products that contain nicotine or tobacco, such as cigarettes, e-cigarettes, and chewing tobacco. If you need help quitting, ask your health care provider.  If you are sexually active, practice safe sex. Use a condom or other form of birth control (contraception) in order to prevent pregnancy and STIs (sexually transmitted infections).  If told by your health care provider, take low-dose aspirin daily starting at age 43. What's next?  Visit your health care provider once a year for a well check visit.  Ask your health care provider how often you should  have your eyes and teeth checked.  Stay up to date on all vaccines. This information is not intended to replace advice given to you by your health care provider. Make sure you discuss any questions you have with your health care provider. Document Revised: 09/02/2017 Document Reviewed: 09/02/2017 Elsevier Patient Education  [employer].   Tobacco Use Disorder Tobacco use disorder (TUD) occurs when a person craves, seeks, and uses tobacco, regardless of the consequences. This disorder can cause problems with mental and physical health. It can affect your ability to have healthy relationships, and it can keep you from meeting your responsibilities at work, home, or school. Tobacco may be:  Smoked as a cigarette or cigar.  Inhaled using e-cigarettes.  Smoked in a pipe or hookah.  Chewed as smokeless tobacco.  Inhaled into the nostrils as snuff. Tobacco products contain a dangerous chemical called nicotine, which is very addictive. Nicotine triggers hormones that make the body feel stimulated and works on areas of the brain that make you feel good. These effects can make it hard for  people to quit nicotine. Tobacco contains many other unsafe chemicals that can damage almost every organ in the body. Smoking tobacco also puts others in danger due to fire risk and possible health problems caused by breathing in secondhand smoke. What are the signs or symptoms? Symptoms of TUD may include:  Being unable to slow down or stop your tobacco use.  Spending an abnormal amount of time getting or using tobacco.  Craving tobacco. Cravings may last for up to 6 months after quitting.  Tobacco use that: ? Interferes with your work, school, or home life. ? Interferes with your personal and social relationships. ? Makes you give up activities that you once enjoyed or found important.  Using tobacco even though you know that it is: ? Dangerous or bad for your health or someone else's  health. ? Causing problems in your life.  Needing more and more of the substance to get the same effect (developing tolerance).  Experiencing unpleasant symptoms if you do not use the substance (withdrawal). Withdrawal symptoms may include: ? Depressed, anxious, or irritable mood. ? Difficulty concentrating. ? Increased appetite. ? Restlessness or trouble sleeping.  Using the substance to avoid withdrawal. How is this diagnosed? This condition may be diagnosed based on:  Your current and past tobacco use. Your health care provider may ask questions about how your tobacco use affects your life.  A physical exam. You may be diagnosed with TUD if you have at least two symptoms within a 47-monthperiod. How is this treated? This condition is treated by stopping tobacco use. Many people are unable to quit on their own and need help. Treatment may include:  Nicotine replacement therapy (NRT). NRT provides nicotine without the other harmful chemicals in tobacco. NRT gradually lowers the dosage of nicotine in the body and reduces withdrawal symptoms. NRT is available as: ? Over-the-counter gums, lozenges, and skin patches. ? Prescription mouth inhalers and nasal sprays.  Medicine that acts on the brain to reduce cravings and withdrawal symptoms.  A type of talk therapy that examines your triggers for tobacco use, how to avoid them, and how to cope with cravings (behavioral therapy).  Hypnosis. This may help with withdrawal symptoms.  Joining a support group for others coping with TUD. The best treatment for TUD is usually a combination of medicine, talk therapy, and support groups. Recovery can be a long process. Many people start using tobacco again after stopping (relapse). If you relapse, it does not mean that treatment will not work. Follow these instructions at home:  Lifestyle  Do not use any products that contain nicotine or tobacco, such as cigarettes and e-cigarettes.  Avoid  things that trigger tobacco use as much as you can. Triggers include people and situations that usually cause you to use tobacco.  Avoid drinks that contain caffeine, including coffee. These may worsen some withdrawal symptoms.  Find ways to manage stress. Wanting to smoke may cause stress, and stress can make you want to smoke. Relaxation techniques such as deep breathing, meditation, and yoga may help.  Attend support groups as needed. These groups are an important part of long-term recovery for many people. General instructions  Take over-the-counter and prescription medicines only as told by your health care provider.  Check with your health care provider before taking any new prescription or over-the-counter medicines.  Decide on a friend, family member, or smoking quit-line (such as 1-800-QUIT-NOW in the [place] that you can call or text when you feel the urge to  smoke or when you need help coping with cravings.  Keep all follow-up visits as told by your health care provider and therapist. This is important. Contact a health care provider if:  You are not able to take your medicines as prescribed.  Your symptoms get worse, even with treatment. Summary  Tobacco use disorder (TUD) occurs when a person craves, seeks, and uses tobacco regardless of the consequences.  This condition may be diagnosed based on your current and past tobacco use and a physical exam.  Many people are unable to quit on their own and need help. Recovery can be a long process.  The most effective treatment for TUD is usually a combination of medicine, talk therapy, and support groups. This information is not intended to replace advice given to you by your health care provider. Make sure you discuss any questions you have with your health care provider. Document Revised: 12/09/2016 Document Reviewed: 12/09/2016 Elsevier Patient Education  2020 [employer].

## 2019-11-28 LAB — IGP, APTIMA HPV, RFX 16/18,45
HPV Aptima: NEGATIVE
PAP Smear Comment: 0

## 2019-12-05 ENCOUNTER — Other Ambulatory Visit: Payer: Self-pay | Admitting: Physician Assistant

## 2019-12-05 DIAGNOSIS — E7889 Other lipoprotein metabolism disorders: Secondary | ICD-10-CM

## 2019-12-19 ENCOUNTER — Other Ambulatory Visit: Payer: Self-pay | Admitting: Family Medicine

## 2019-12-19 DIAGNOSIS — I10 Essential (primary) hypertension: Secondary | ICD-10-CM

## 2020-01-03 ENCOUNTER — Other Ambulatory Visit: Payer: Self-pay

## 2020-01-03 DIAGNOSIS — I8393 Asymptomatic varicose veins of bilateral lower extremities: Secondary | ICD-10-CM

## 2020-01-12 ENCOUNTER — Encounter: Payer: Self-pay | Admitting: Family Medicine

## 2020-01-19 ENCOUNTER — Telehealth: Payer: Self-pay

## 2020-01-19 ENCOUNTER — Ambulatory Visit (INDEPENDENT_AMBULATORY_CARE_PROVIDER_SITE_OTHER): Payer: BC Managed Care – PPO | Admitting: Physician Assistant

## 2020-01-19 ENCOUNTER — Other Ambulatory Visit: Payer: Self-pay

## 2020-01-19 ENCOUNTER — Ambulatory Visit (HOSPITAL_COMMUNITY)
Admission: RE | Admit: 2020-01-19 | Discharge: 2020-01-19 | Disposition: A | Discharge status: Home / Self Care | Payer: BC Managed Care – PPO | Source: Ambulatory Visit | Attending: Vascular Surgery | Admitting: Vascular Surgery

## 2020-01-19 VITALS — BP 132/76 | HR 84 | Temp 98.1°F | Resp 20 | Ht 69.0 in | Wt 242.8 lb

## 2020-01-19 DIAGNOSIS — I8393 Asymptomatic varicose veins of bilateral lower extremities: Secondary | ICD-10-CM

## 2020-01-19 DIAGNOSIS — I868 Varicose veins of other specified sites: Secondary | ICD-10-CM

## 2020-01-19 DIAGNOSIS — I872 Venous insufficiency (chronic) (peripheral): Secondary | ICD-10-CM | POA: Diagnosis not present

## 2020-01-19 NOTE — Telephone Encounter (Signed)
Called pt to discuss sclerotherapy. Pt wants to think more about it and will call back if she is interested in making an appt.

## 2020-01-19 NOTE — Progress Notes (Signed)
Requested by:  Blane Ohara, MD [address] [place],  [place] [address]  Reason for consultation: Spider veins   History of Present Illness   Kathleen Wells is a 60 y.o. ([birth year]) female who presents for evaluation of bilateral lower extremity spider veins.  The patient has developed bilateral, purple-colored surface veins of the lower extremities.  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  She also has mild edema that is worse with prolonged periods of sitting or standing.  This goes away with elevation.  Venous symptoms include: positive if (X) [  ] aching [  ] heavy [  ] tired  [  ] throbbing [  ] burning  [  ] itching [x ]swelling [  ] bleeding [  ] ulcer Onset/duration: 2 to 3 years Occupation: Airline pilot position at [employer] Aggravating factors: sitting, standing for prolonged periods Alleviating factors: compression stockings Compression:  yes Helps:  yes Pain medications:  none Previous vein procedures:  no History of DVT:  no  Past Medical History:  Diagnosis Date  . Essential (primary) hypertension   . Mixed hyperlipidemia   . Obstructive sleep apnea (adult) (pediatric)   . Type 2 diabetes mellitus with other specified complication (HCC)     No past surgical history on file.  Social History   Socioeconomic History  . Marital status: Media planner    Spouse name: Not on file  . Number of children: 0  . Years of education: Not on file  . Highest education level: Not on file  Occupational History  . Occupation: OFFICE  Tobacco Use  . Smoking status: Current Every Day Smoker    Packs/day: 0.75    Years: 42.00    Pack years: 31.50    Types: Cigarettes  . Smokeless tobacco: Never Used  Vaping Use  . Vaping Use: Never used  Substance and Sexual Activity  . Alcohol use: Yes    Alcohol/week: 8.0 standard drinks     Types: 4 Cans of beer, 4 Standard drinks or equivalent per week  . Drug use: Never  . Sexual activity: Not on file  Other Topics Concern  . Not on file  Social History Narrative  . Not on file   Social Determinants of Health   Financial Resource Strain: Not on file  Food Insecurity: Not on file  Transportation Needs: Not on file  Physical Activity: Not on file  Stress: Not on file  Social Connections: Not on file  Intimate Partner Violence: Not At Risk  . Fear of Current or Ex-Partner: No  . Emotionally Abused: No  . Physically Abused: No  . Sexually Abused: No    Family History  Problem Relation Age of Onset  . Lung cancer Mother   . Congestive Heart Failure Father   . Lupus Sister   . Hepatitis C Brother   . Stomach cancer Maternal Grandmother     Current Outpatient Medications  Medication Sig Dispense Refill  . aspirin 81 MG chewable tablet Chew 81 mg by mouth daily.    Marland Kitchen atorvastatin (LIPITOR) 20 MG tablet TAKE 1 TABLET BY MOUTH EVERY DAY 90 tablet 0  . olmesartan (BENICAR) 20 MG tablet TAKE 1 TABLET BY MOUTH EVERY DAY 90 tablet 1   No current facility-administered medications for this visit.    Allergies  Allergen Reactions  . Lisinopril Diarrhea  .  Septra [Sulfamethoxazole-Trimethoprim] Hives  . Sulfa Antibiotics Hives    REVIEW OF SYSTEMS (negative unless checked):   Cardiac:  []  Chest pain or chest pressure? []  Shortness of breath upon activity? []  Shortness of breath when lying flat? []  Irregular heart rhythm?  Vascular:  []  Pain in calf, thigh, or hip brought on by walking? []  Pain in feet at night that wakes you up from your sleep? []  Blood clot in your veins? [x]  Leg swelling?  Pulmonary:  []  Oxygen at home? []  Productive cough? []  Wheezing?  Neurologic:  []  Sudden weakness in arms or legs? []  Sudden numbness in arms or legs? []  Sudden onset of difficult speaking or slurred speech? []  Temporary loss of vision in one eye? []   Problems with dizziness?  Gastrointestinal:  []  Blood in stool? []  Vomited blood?  Genitourinary:  []  Burning when urinating? []  Blood in urine?  Psychiatric:  []  Major depression  Hematologic:  []  Bleeding problems? []  Problems with blood clotting?  Dermatologic:  []  Rashes or ulcers?  Constitutional:  []  Fever or chills?  Ear/Nose/Throat:  []  Change in hearing? []  Nose bleeds? []  Sore throat?  Musculoskeletal:  []  Back pain? []  Joint pain? []  Muscle pain?   Physical Examination     Vitals:   01/19/20 1151  BP: 132/76  Pulse: 84  Resp: 20  Temp: 98.1 F (36.7 C)  SpO2: 95%   General:  WDWN in NAD; vital signs documented above Gait: Unaided, no ataxia HENT: WNL, normocephalic Pulmonary: normal non-labored breathing , without Rales, rhonchi,  wheezing Cardiac: regular HR,  Skin: without rashes Extremities: with small left lateral thigh varicose vein, with reticular veins, without edema, without stasis pigmentation, without lipodermatosclerosis, without ulcers.  Palpable pedal pulses bilaterally Musculoskeletal: no muscle wasting or atrophy  Neurologic: A&O X 3;  No focal weakness or paresthesias are detected Psychiatric:  The pt has Normal affect.  Right thigh   Right lower leg   Right ankle    Left lower leg    Non-invasive Vascular Imaging   BLE Venous Insufficiency Duplex (01/19/2020):  There is reflux of the greater saphenous vein at the proximal thigh, knee and proximal calf.  The vein is not sufficiently dilated along its course.  On the left, there is reflux of the greater saphenous vein only at the knee level.  No evidence of DVT or superficial venous thrombus in either extremity.   Medical Decision Making   Kathleen Wells is a 60 y.o. female who presents with: Bilateral lower extremity venous reflux, left greater than right.  The vein is not sufficiently dilated on the left to consider ablation therapy.  No evidence of DVT.  No  evidence of arterial insufficiency.  Based on the patient's history and examination, I recommend: Elevation, compression stockings.  "Healthy Veins Made Simple" written information provided.  We discussed possible sclerotherapy.  I informed the patient this is not covered by insurance and would require out-of-pocket payment.  We discussed initial cost of $250 per vial of sclerotherapy agent.  She would like a call back regarding possible sclerotherapy, costs and endurance of therapy.    Thank you for allowing to participate in this patient's care.   , PA-C [facility] Office: [phone]  01/19/2020, 11:49 AM  Clinic MD: 

## 2020-02-21 NOTE — Progress Notes (Signed)
Subjective:  Patient ID: Kathleen Wells, female    DOB: 1960/03/28  Age: 60 y.o. MRN: [id number]  Chief Complaint  Patient presents with  . Hypertension  . Diabetes  . Hyperlipidemia   HPI Essential (primary) hypertension-Benicar 20 mg once daily  Type 2 diabetes mellitus with other specified complication, with long-term current use of insulin (HCC) - Controlled with diet, checks feet daily, would like to have a CGM if possible. Due for a eye appt  Mixed hyperlipidemia-Lipitor 20 mg once daily.  Obstructive sleep apnea (adult) (pediatric): wearing CPAP.  Compliant.  Benefits.  Tobacco use: Patient continues to smoke.  She does admit to some wheezing occasionally.  She has never had lung testing.  Recent CT scan showed evidence of emphysema.  Aortic atherosclerosis: Patient currently taking Lipitor and aspirin.  Current Outpatient Medications on File Prior to Visit  Medication Sig Dispense Refill  . aspirin 81 MG chewable tablet Chew 81 mg by mouth daily.    Marland Kitchen atorvastatin (LIPITOR) 20 MG tablet TAKE 1 TABLET BY MOUTH EVERY DAY 90 tablet 0  . olmesartan (BENICAR) 20 MG tablet TAKE 1 TABLET BY MOUTH EVERY DAY 90 tablet 1   No current facility-administered medications on file prior to visit.   Past Medical History:  Diagnosis Date  . Essential (primary) hypertension   . Mixed hyperlipidemia   . Obstructive sleep apnea (adult) (pediatric)   . Type 2 diabetes mellitus with other specified complication (HCC)    No past surgical history on file.  Family History  Problem Relation Age of Onset  . Lung cancer Mother   . Congestive Heart Failure Father   . Lupus Sister   . Hepatitis C Brother   . Stomach cancer Maternal Grandmother    Social History   Socioeconomic History  . Marital status: Soil scientist    Spouse name: Not on file  . Number of children: 0  . Years of education: Not on file  . Highest education level: Not on file  Occupational History  . Occupation:  OFFICE  Tobacco Use  . Smoking status: Current Every Day Smoker    Packs/day: 0.75    Years: 42.00    Pack years: 31.50    Types: Cigarettes  . Smokeless tobacco: Never Used  Vaping Use  . Vaping Use: Never used  Substance and Sexual Activity  . Alcohol use: Yes    Alcohol/week: 8.0 standard drinks    Types: 4 Cans of beer, 4 Standard drinks or equivalent per week  . Drug use: Never  . Sexual activity: Not on file  Other Topics Concern  . Not on file  Social History Narrative  . Not on file   Social Determinants of Health   Financial Resource Strain: Not on file  Food Insecurity: Not on file  Transportation Needs: Not on file  Physical Activity: Not on file  Stress: Not on file  Social Connections: Not on file    Review of Systems  Constitutional: Negative for chills, fatigue and fever.  HENT: Negative for congestion, ear pain, rhinorrhea and sore throat.   Respiratory: Negative for cough and shortness of breath.   Cardiovascular: Negative for chest pain.  Gastrointestinal: Negative for abdominal pain, constipation, diarrhea, nausea and vomiting.  Genitourinary: Negative for dysuria and urgency.  Musculoskeletal: Negative for back pain and myalgias.  Neurological: Negative for dizziness, weakness, light-headedness and headaches.  Psychiatric/Behavioral: Negative for dysphoric mood. The patient is not nervous/anxious.      Objective:  BP  134/82   Pulse 84   Temp (!) 97.4 F (36.3 C)   Ht '5\' 9"'  (1.753 m)   Wt 244 lb (110.7 kg)   SpO2 99%   BMI 36.03 kg/m   BP/Weight 02/22/2020 01/19/2020 45/40/9811  Systolic BP 914 782 956  Diastolic BP 82 76 60  Wt. (Lbs) 244 242.8 243.6  BMI 36.03 35.86 35.97    Physical Exam Vitals reviewed.  Constitutional:      Appearance: Normal appearance. She is normal weight.  Neck:     Vascular: No carotid bruit.  Cardiovascular:     Rate and Rhythm: Normal rate and regular rhythm.     Pulses: Normal pulses.     Heart  sounds: Normal heart sounds.  Pulmonary:     Effort: Pulmonary effort is normal. No respiratory distress.     Breath sounds: Normal breath sounds.  Abdominal:     General: Abdomen is flat. Bowel sounds are normal.     Palpations: Abdomen is soft.     Tenderness: There is no abdominal tenderness.  Neurological:     Mental Status: She is alert and oriented to person, place, and time.  Psychiatric:        Mood and Affect: Mood normal.        Behavior: Behavior normal.     Diabetic Foot Exam - Simple   No data filed      Lab Results  Component Value Date   WBC 10.2 08/22/2019   HGB 14.7 08/22/2019   HCT 42.2 08/22/2019   PLT 312 08/22/2019   GLUCOSE 110 (H) 08/22/2019   CHOL 166 08/22/2019   TRIG 97 08/22/2019   HDL 61 08/22/2019   LDLCALC 87 08/22/2019   ALT 26 08/22/2019   AST 22 08/22/2019   NA 139 08/22/2019   K 4.6 08/22/2019   CL 104 08/22/2019   CREATININE 0.61 08/22/2019   BUN 13 08/22/2019   CO2 21 08/22/2019   HGBA1C 6.0 (H) 08/22/2019   MICROALBUR 10 02/22/2020      Assessment & Plan:   1. Essential (primary) hypertension Well controlled.  No changes to medicines.  Continue to work on eating a healthy diet and exercise.  Labs drawn today.  - Comprehensive metabolic panel  2. Type 2 diabetes mellitus with other specified complication, with long-term current use of insulin (HCC) Control: good Recommend check sugars fasting 2-3 times per week. Recommend check feet daily. Recommend annual eye exams. Medicines: none Continue to work on eating a healthy diet and exercise.  Labs drawn today.   - Hemoglobin A1c - CBC with Differential/Platelet - POCT UA - Microalbumin - blood glucose meter kit and supplies KIT; Dispense based on patient and insurance preference. Use up to four times daily as directed. (FOR ICD-9 250.00, 250.01).  Dispense: 1 each; Refill: 0  3. Mixed hyperlipidemia Low fat diet and exercise.  Continue lipitor.  May need to  increase dose after labs viewed.  - Lipid panel  4. Obstructive sleep apnea (adult) (pediatric) Continue cpap.   5. Simple chronic bronchitis ([place]) Start on breztri 2 puffs twice day. Discussed importance of rinsing mouth out after use.  - Peak flow (250,250,250) - Budeson-Glycopyrrol-Formoterol (BREZTRI AEROSPHERE) 160-9-4.8 MCG/ACT AERO; Inhale 2 puffs into the lungs in the morning and at bedtime.  Dispense: 10.7 g; Refill: 3  6. Atherosclerosis of aorta (HCC) Continue aspirin and lipitor.  7. Class 2 severe obesity due to excess calories with serious comorbidity and body mass index (BMI)  of 36.0 to 36.9 in adult [facility]) Recommend continue to work on eating healthy diet and exercise.   8. Need for hepatitis C screening test - HCV Ab w Reflex to Quant PCR  9. Encounter for screening for HIV - HIV Antibody (routine testing w rflx)  10. Cigarette nicotine dependence with nicotine-induced disorder  Smoking cessation recommended.  Education given.    Meds ordered this encounter  Medications  . blood glucose meter kit and supplies KIT    Sig: Dispense based on patient and insurance preference. Use up to four times daily as directed. (FOR ICD-9 250.00, 250.01).    Dispense:  1 each    Refill:  0    E11.69 diabetes    Order Specific Question:   Number of strips    Answer:   100    Order Specific Question:   Number of lancets    Answer:   100  . Budeson-Glycopyrrol-Formoterol (BREZTRI AEROSPHERE) 160-9-4.8 MCG/ACT AERO    Sig: Inhale 2 puffs into the lungs in the morning and at bedtime.    Dispense:  10.7 g    Refill:  3    Orders Placed This Encounter  Procedures  . Lipid panel  . Hemoglobin A1c  . Comprehensive metabolic panel  . CBC with Differential/Platelet  . HCV Ab w Reflex to Quant PCR  . HIV Antibody (routine testing w rflx)  . Peak flow  . POCT UA - Microalbumin    Follow-up: Return in about 6 months (around 08/21/2020).  An After Visit Summary was printed  and given to the patient.  Rochel Brome, MD [facility] [phone]

## 2020-02-22 ENCOUNTER — Other Ambulatory Visit: Payer: Self-pay

## 2020-02-22 ENCOUNTER — Ambulatory Visit: Payer: BC Managed Care – PPO | Admitting: Family Medicine

## 2020-02-22 ENCOUNTER — Encounter: Payer: Self-pay | Admitting: Family Medicine

## 2020-02-22 VITALS — BP 134/82 | HR 84 | Temp 97.4°F | Ht 69.0 in | Wt 244.0 lb

## 2020-02-22 DIAGNOSIS — E782 Mixed hyperlipidemia: Secondary | ICD-10-CM

## 2020-02-22 DIAGNOSIS — Z114 Encounter for screening for human immunodeficiency virus [HIV]: Secondary | ICD-10-CM

## 2020-02-22 DIAGNOSIS — Z1159 Encounter for screening for other viral diseases: Secondary | ICD-10-CM

## 2020-02-22 DIAGNOSIS — E1169 Type 2 diabetes mellitus with other specified complication: Secondary | ICD-10-CM

## 2020-02-22 DIAGNOSIS — Z6836 Body mass index (BMI) 36.0-36.9, adult: Secondary | ICD-10-CM

## 2020-02-22 DIAGNOSIS — Z794 Long term (current) use of insulin: Secondary | ICD-10-CM | POA: Diagnosis not present

## 2020-02-22 DIAGNOSIS — F17219 Nicotine dependence, cigarettes, with unspecified nicotine-induced disorders: Secondary | ICD-10-CM

## 2020-02-22 DIAGNOSIS — G4733 Obstructive sleep apnea (adult) (pediatric): Secondary | ICD-10-CM

## 2020-02-22 DIAGNOSIS — I700 Atherosclerosis of aorta: Secondary | ICD-10-CM

## 2020-02-22 DIAGNOSIS — J410 Simple chronic bronchitis: Secondary | ICD-10-CM

## 2020-02-22 DIAGNOSIS — I10 Essential (primary) hypertension: Secondary | ICD-10-CM

## 2020-02-22 LAB — POCT UA - MICROALBUMIN: Microalbumin Ur, POC: 10 mg/L

## 2020-02-22 MED ORDER — BLOOD GLUCOSE MONITOR KIT: PACK | 0 refills | Status: DC

## 2020-02-22 MED ORDER — BREZTRI AEROSPHERE 160-9-4.8 MCG/ACT IN AERO: 2.0000 | INHALATION_SPRAY | Freq: Two times a day (BID) | RESPIRATORY_TRACT | 3 refills | Status: DC

## 2020-02-22 NOTE — Patient Instructions (Signed)
COPD: Start on breztri 2 puffs twice a day.  Recommend continue to wean and quit smoking!! Good Job!!   Chronic Obstructive Pulmonary Disease  Chronic obstructive pulmonary disease (COPD) is a long-term (chronic) lung problem. When you have COPD, it is hard for air to get in and out of your lungs. Usually the condition gets worse over time, and your lungs will never return to normal. There are things you can do to keep yourself as healthy as possible. What are the causes?  Smoking. This is the most common cause.  Certain genes passed from parent to child (inherited). What increases the risk?  Being exposed to secondhand smoke from cigarettes, pipes, or cigars.  Being exposed to chemicals and other irritants, such as fumes and dust in the work environment.  Having chronic lung conditions or infections. What are the signs or symptoms?  Shortness of breath, especially during physical activity.  A long-term cough with a large amount of thick mucus. Sometimes, the cough may not have any mucus (dry cough).  Wheezing.  Breathing quickly.  Skin that looks gray or blue, especially in the fingers, toes, or lips.  Feeling tired (fatigue).  Weight loss.  Chest tightness.  Having infections often.  Episodes when breathing symptoms become much worse (exacerbations). At the later stages of this disease, you may have swelling in the ankles, feet, or legs. How is this treated?  Taking medicines.  Quitting smoking, if you smoke.  Rehabilitation. This includes steps to make your body work better. It may involve a team of specialists.  Doing exercises.  Making changes to your diet.  Using oxygen.  Lung surgery.  Lung transplant.  Comfort measures (palliative care). Follow these instructions at home: Medicines  Take over-the-counter and prescription medicines only as told by your doctor.  Talk to your doctor before taking any cough or allergy medicines. You may need to  avoid medicines that cause your lungs to be dry. Lifestyle  If you smoke, stop smoking. Smoking makes the problem worse.  Do not smoke or use any products that contain nicotine or tobacco. If you need help quitting, ask your doctor.  Avoid being around things that make your breathing worse. This may include smoke, chemicals, and fumes.  Stay active, but remember to rest as well.  Learn and use tips on how to manage stress and control your breathing.  Make sure you get enough sleep. Most adults need at least 7 hours of sleep every night.  Eat healthy foods. Eat smaller meals more often. Rest before meals. Controlled breathing Learn and use tips on how to control your breathing as told by your doctor. Try:  Breathing in (inhaling) through your nose for 1 second. Then, pucker your lips and breath out (exhale) through your lips for 2 seconds.  Putting one hand on your belly (abdomen). Breathe in slowly through your nose for 1 second. Your hand on your belly should move out. Pucker your lips and breathe out slowly through your lips. Your hand on your belly should move in as you breathe out.   Controlled coughing Learn and use controlled coughing to clear mucus from your lungs. Follow these steps: 1. Lean your head a little forward. 2. Breathe in deeply. 3. Try to hold your breath for 3 seconds. 4. Keep your mouth slightly open while coughing 2 times. 5. Spit any mucus out into a tissue. 6. Rest and do the steps again 1 or 2 times as needed. General instructions  Make sure  you get all the shots (vaccines) that your doctor recommends. Ask your doctor about a flu shot and a pneumonia shot.  Use oxygen therapy and pulmonary rehabilitation if told by your doctor. If you need home oxygen therapy, ask your doctor if you should buy a tool to measure your oxygen level (oximeter).  Make a COPD action plan with your doctor. This helps you to know what to do if you feel worse than usual.  Manage  any other conditions you have as told by your doctor.  Avoid going outside when it is very hot, cold, or humid.  Avoid people who have a sickness you can catch (contagious).  Keep all follow-up visits. Contact a doctor if:  You cough up more mucus than usual.  There is a change in the color or thickness of the mucus.  It is harder to breathe than usual.  Your breathing is faster than usual.  You have trouble sleeping.  You need to use your medicines more often than usual.  You have trouble doing your normal activities such as getting dressed or walking around the house. Get help right away if:  You have shortness of breath while resting.  You have shortness of breath that stops you from: ? Being able to talk. ? Doing normal activities.  Your chest hurts for longer than 5 minutes.  Your skin color is more blue than usual.  Your pulse oximeter shows that you have low oxygen for longer than 5 minutes.  You have a fever.  You feel too tired to breathe normally. These symptoms may represent a serious problem that is an emergency. Do not wait to see if the symptoms will go away. Get medical help right away. Call your local emergency services (911 in the [place] Do not drive yourself to the hospital. Summary  Chronic obstructive pulmonary disease (COPD) is a long-term lung problem.  The way your lungs work will never return to normal. Usually the condition gets worse over time. There are things you can do to keep yourself as healthy as possible.  Take over-the-counter and prescription medicines only as told by your doctor.  If you smoke, stop. Smoking makes the problem worse. This information is not intended to replace advice given to you by your health care provider. Make sure you discuss any questions you have with your health care provider. Document Revised: 10/31/2019 Document Reviewed: 10/31/2019 Elsevier Patient Education  2021 [employer] Inc.   Tobacco Use  Disorder Tobacco use disorder (TUD) occurs when a person craves, seeks, and uses tobacco, regardless of the consequences. This disorder can cause problems with mental and physical health. It can affect your ability to have healthy relationships, and it can keep you from meeting your responsibilities at work, home, or school. Tobacco may be:  Smoked as a cigarette or cigar.  Inhaled using e-cigarettes.  Smoked in a pipe or hookah.  Chewed as smokeless tobacco.  Inhaled into the nostrils as snuff. Tobacco products contain a dangerous chemical called nicotine, which is very addictive. Nicotine triggers hormones that make the body feel stimulated and works on areas of the brain that make you feel good. These effects can make it hard for people to quit nicotine. Tobacco contains many other unsafe chemicals that can damage almost every organ in the body. Smoking tobacco also puts others in danger due to fire risk and possible health problems caused by breathing in secondhand smoke. What are the signs or symptoms? Symptoms of TUD may include:  Being unable to slow down or stop your tobacco use.  Spending an abnormal amount of time getting or using tobacco.  Craving tobacco. Cravings may last for up to 6 months after quitting.  Tobacco use that: ? Interferes with your work, school, or home life. ? Interferes with your personal and social relationships. ? Makes you give up activities that you once enjoyed or found important.  Using tobacco even though you know that it is: ? Dangerous or bad for your health or someone else's health. ? Causing problems in your life.  Needing more and more of the substance to get the same effect (developing tolerance).  Experiencing unpleasant symptoms if you do not use the substance (withdrawal). Withdrawal symptoms may include: ? Depressed, anxious, or irritable mood. ? Difficulty concentrating. ? Increased appetite. ? Restlessness or trouble  sleeping.  Using the substance to avoid withdrawal. How is this diagnosed? This condition may be diagnosed based on:  Your current and past tobacco use. Your health care provider may ask questions about how your tobacco use affects your life.  A physical exam. You may be diagnosed with TUD if you have at least two symptoms within a 34-month period. How is this treated? This condition is treated by stopping tobacco use. Many people are unable to quit on their own and need help. Treatment may include:  Nicotine replacement therapy (NRT). NRT provides nicotine without the other harmful chemicals in tobacco. NRT gradually lowers the dosage of nicotine in the body and reduces withdrawal symptoms. NRT is available as: ? Over-the-counter gums, lozenges, and skin patches. ? Prescription mouth inhalers and nasal sprays.  Medicine that acts on the brain to reduce cravings and withdrawal symptoms.  A type of talk therapy that examines your triggers for tobacco use, how to avoid them, and how to cope with cravings (behavioral therapy).  Hypnosis. This may help with withdrawal symptoms.  Joining a support group for others coping with TUD. The best treatment for TUD is usually a combination of medicine, talk therapy, and support groups. Recovery can be a long process. Many people start using tobacco again after stopping (relapse). If you relapse, it does not mean that treatment will not work. Follow these instructions at home: Lifestyle  Do not use any products that contain nicotine or tobacco, such as cigarettes and e-cigarettes.  Avoid things that trigger tobacco use as much as you can. Triggers include people and situations that usually cause you to use tobacco.  Avoid drinks that contain caffeine, including coffee. These may worsen some withdrawal symptoms.  Find ways to manage stress. Wanting to smoke may cause stress, and stress can make you want to smoke. Relaxation techniques such as deep  breathing, meditation, and yoga may help.  Attend support groups as needed. These groups are an important part of long-term recovery for many people. General instructions  Take over-the-counter and prescription medicines only as told by your health care provider.  Check with your health care provider before taking any new prescription or over-the-counter medicines.  Decide on a friend, family member, or smoking quit-line (such as 1-800-QUIT-NOW in the [place] that you can call or text when you feel the urge to smoke or when you need help coping with cravings.  Keep all follow-up visits as told by your health care provider and therapist. This is important.   Contact a health care provider if:  You are not able to take your medicines as prescribed.  Your symptoms get worse, even with treatment.  Summary  Tobacco use disorder (TUD) occurs when a person craves, seeks, and uses tobacco regardless of the consequences.  This condition may be diagnosed based on your current and past tobacco use and a physical exam.  Many people are unable to quit on their own and need help. Recovery can be a long process.  The most effective treatment for TUD is usually a combination of medicine, talk therapy, and support groups. This information is not intended to replace advice given to you by your health care provider. Make sure you discuss any questions you have with your health care provider. Document Revised: 12/09/2016 Document Reviewed: 12/09/2016 Elsevier Patient Education  2021 [employer]

## 2020-02-23 LAB — COMPREHENSIVE METABOLIC PANEL
ALT: 22 IU/L (ref 0–32)
AST: 20 IU/L (ref 0–40)
Albumin/Globulin Ratio: 1.8 (ref 1.2–2.2)
Albumin: 4.7 g/dL (ref 3.8–4.9)
Alkaline Phosphatase: 110 IU/L (ref 44–121)
BUN/Creatinine Ratio: 17 (ref 9–23)
BUN: 11 mg/dL (ref 6–24)
Bilirubin Total: 0.6 mg/dL (ref 0.0–1.2)
CO2: 20 mmol/L (ref 20–29)
Calcium: 9.8 mg/dL (ref 8.7–10.2)
Chloride: 104 mmol/L (ref 96–106)
Creatinine, Ser: 0.65 mg/dL (ref 0.57–1.00)
GFR calc Af Amer: 112 mL/min/{1.73_m2} (ref >59)
GFR calc non Af Amer: 97 mL/min/{1.73_m2} (ref >59)
Globulin, Total: 2.6 g/dL (ref 1.5–4.5)
Glucose: 116 mg/dL — ABNORMAL HIGH (ref 65–99)
Potassium: 4.7 mmol/L (ref 3.5–5.2)
Sodium: 142 mmol/L (ref 134–144)
Total Protein: 7.3 g/dL (ref 6.0–8.5)

## 2020-02-23 LAB — CBC WITH DIFFERENTIAL/PLATELET
Basophils Absolute: 0.1 10*3/uL (ref 0.0–0.2)
Basos: 1 %
EOS (ABSOLUTE): 0.2 10*3/uL (ref 0.0–0.4)
Eos: 2 %
Hematocrit: 40.7 % (ref 34.0–46.6)
Hemoglobin: 13.8 g/dL (ref 11.1–15.9)
Immature Grans (Abs): 0 10*3/uL (ref 0.0–0.1)
Immature Granulocytes: 0 %
Lymphocytes Absolute: 2.6 10*3/uL (ref 0.7–3.1)
Lymphs: 25 %
MCH: 32 pg (ref 26.6–33.0)
MCHC: 33.9 g/dL (ref 31.5–35.7)
MCV: 94 fL (ref 79–97)
Monocytes Absolute: 0.7 10*3/uL (ref 0.1–0.9)
Monocytes: 7 %
Neutrophils Absolute: 6.9 10*3/uL (ref 1.4–7.0)
Neutrophils: 65 %
Platelets: 307 10*3/uL (ref 150–450)
RBC: 4.31 x10E6/uL (ref 3.77–5.28)
RDW: 12.3 % (ref 11.7–15.4)
WBC: 10.4 10*3/uL (ref 3.4–10.8)

## 2020-02-23 LAB — HEMOGLOBIN A1C
Est. average glucose Bld gHb Est-mCnc: 120 mg/dL
Hgb A1c MFr Bld: 5.8 % — ABNORMAL HIGH (ref 4.8–5.6)

## 2020-02-23 LAB — LIPID PANEL
Chol/HDL Ratio: 2.5 ratio (ref 0.0–4.4)
Cholesterol, Total: 163 mg/dL (ref 100–199)
HDL: 64 mg/dL (ref >39)
LDL Chol Calc (NIH): 83 mg/dL (ref 0–99)
Triglycerides: 88 mg/dL (ref 0–149)
VLDL Cholesterol Cal: 16 mg/dL (ref 5–40)

## 2020-02-23 LAB — HCV INTERPRETATION

## 2020-02-23 LAB — HIV ANTIBODY (ROUTINE TESTING W REFLEX): HIV Screen 4th Generation wRfx: NONREACTIVE

## 2020-02-23 LAB — HCV AB W REFLEX TO QUANT PCR: HCV Ab: <0.1 s/co ratio (ref 0.0–0.9)

## 2020-02-23 LAB — CARDIOVASCULAR RISK ASSESSMENT

## 2020-02-29 ENCOUNTER — Other Ambulatory Visit: Payer: Self-pay | Admitting: Physician Assistant

## 2020-02-29 DIAGNOSIS — E7889 Other lipoprotein metabolism disorders: Secondary | ICD-10-CM

## 2020-03-08 ENCOUNTER — Other Ambulatory Visit: Payer: Self-pay | Admitting: Family Medicine

## 2020-05-30 LAB — HM DIABETES EYE EXAM

## 2020-06-13 ENCOUNTER — Other Ambulatory Visit: Payer: Self-pay | Admitting: Family Medicine

## 2020-06-13 DIAGNOSIS — I10 Essential (primary) hypertension: Secondary | ICD-10-CM

## 2020-08-21 ENCOUNTER — Other Ambulatory Visit: Payer: Self-pay

## 2020-08-21 ENCOUNTER — Encounter: Payer: Self-pay | Admitting: Family Medicine

## 2020-08-21 ENCOUNTER — Ambulatory Visit: Payer: BC Managed Care – PPO | Admitting: Family Medicine

## 2020-08-21 VITALS — BP 128/80 | HR 82 | Temp 96.5°F | Ht 69.0 in | Wt 243.0 lb

## 2020-08-21 DIAGNOSIS — Z6835 Body mass index (BMI) 35.0-35.9, adult: Secondary | ICD-10-CM

## 2020-08-21 DIAGNOSIS — I10 Essential (primary) hypertension: Secondary | ICD-10-CM

## 2020-08-21 DIAGNOSIS — E6609 Other obesity due to excess calories: Secondary | ICD-10-CM | POA: Diagnosis not present

## 2020-08-21 DIAGNOSIS — E782 Mixed hyperlipidemia: Secondary | ICD-10-CM | POA: Diagnosis not present

## 2020-08-21 DIAGNOSIS — E1169 Type 2 diabetes mellitus with other specified complication: Secondary | ICD-10-CM | POA: Diagnosis not present

## 2020-08-21 NOTE — Progress Notes (Signed)
Subjective:  Patient ID: Kathleen Wells, female    DOB: 1960-03-16  Age: 60 y.o. MRN: [id number]  Chief Complaint  Patient presents with   Hypertension   Diabetes    HPI Diabetes:  Complications:none Glucose checking: no Hypoglycemia: sometimes feels like it when she misses meals. Most recent A1C: 5.8. Current medications:  Last Eye Exam: 2022. Foot checks: daily Lifestyle changes: eats healthy. Active.   Hyperlipidemia: Current medications: on lipitor 20 daily.  Hypertension: Current medications: benicar 20 mg once daily and aspirin 81 mg once daily.  Current Outpatient Medications on File Prior to Visit  Medication Sig Dispense Refill   ACCU-CHEK GUIDE test strip USE TO TEST UP TO 4 TIMES DAILY AS DIRECTED 100 strip 2   aspirin 81 MG chewable tablet Chew 81 mg by mouth daily.     atorvastatin (LIPITOR) 20 MG tablet TAKE 1 TABLET BY MOUTH EVERY DAY 90 tablet 1   blood glucose meter kit and supplies KIT Dispense based on patient and insurance preference. Use up to four times daily as directed. (FOR ICD-9 250.00, 250.01). 1 each 0   Budeson-Glycopyrrol-Formoterol (BREZTRI AEROSPHERE) 160-9-4.8 MCG/ACT AERO Inhale 2 puffs into the lungs in the morning and at bedtime. 10.7 g 3   olmesartan (BENICAR) 20 MG tablet TAKE 1 TABLET BY MOUTH EVERY DAY 90 tablet 1   No current facility-administered medications on file prior to visit.   Past Medical History:  Diagnosis Date   Essential (primary) hypertension    Mixed hyperlipidemia    Obstructive sleep apnea (adult) (pediatric)    Type 2 diabetes mellitus with other specified complication ([place])    History reviewed. No pertinent surgical history.  Family History  Problem Relation Age of Onset   Lung cancer Mother    Congestive Heart Failure Father    Lupus Sister    Hepatitis C Brother    Stomach cancer Maternal Grandmother    Social History   Socioeconomic History   Marital status: Soil scientist    Spouse name: Not  on file   Number of children: 0   Years of education: Not on file   Highest education level: Not on file  Occupational History   Occupation: OFFICE  Tobacco Use   Smoking status: Every Day    Packs/day: 0.75    Years: 42.00    Pack years: 31.50    Types: Cigarettes   Smokeless tobacco: Never  Vaping Use   Vaping Use: Never used  Substance and Sexual Activity   Alcohol use: Yes    Alcohol/week: 8.0 standard drinks    Types: 4 Cans of beer, 4 Standard drinks or equivalent per week   Drug use: Never   Sexual activity: Not on file  Other Topics Concern   Not on file  Social History Narrative   Not on file   Social Determinants of Health   Financial Resource Strain: Not on file  Food Insecurity: Not on file  Transportation Needs: Not on file  Physical Activity: Not on file  Stress: Not on file  Social Connections: Not on file    Review of Systems  Constitutional:  Negative for chills, fatigue and fever.  HENT:  Negative for congestion, ear pain, rhinorrhea and sore throat.   Respiratory:  Positive for cough (sometimes). Negative for shortness of breath.   Cardiovascular:  Negative for chest pain.  Gastrointestinal:  Negative for abdominal pain, constipation, diarrhea, nausea and vomiting.  Genitourinary:  Negative for dysuria and urgency.  Musculoskeletal:  Negative for back pain and myalgias.  Neurological:  Negative for dizziness, weakness, light-headedness and headaches.  Psychiatric/Behavioral:  Negative for dysphoric mood. The patient is not nervous/anxious.     Objective:  BP 128/80   Pulse 82   Temp (!) 96.5 F (35.8 C)   Ht '5\' 9"'  (1.753 m)   Wt 243 lb (110.2 kg)   SpO2 99%   BMI 35.88 kg/m   BP/Weight 08/21/2020 02/22/2020 3/66/4403  Systolic BP 474 259 563  Diastolic BP 80 82 76  Wt. (Lbs) 243 244 242.8  BMI 35.88 36.03 35.86    Physical Exam Vitals reviewed.  Constitutional:      Appearance: Normal appearance. She is normal weight.  Neck:      Vascular: No carotid bruit.  Cardiovascular:     Rate and Rhythm: Normal rate and regular rhythm.     Pulses: Normal pulses.     Heart sounds: Normal heart sounds.  Pulmonary:     Effort: Pulmonary effort is normal. No respiratory distress.     Breath sounds: Normal breath sounds.  Abdominal:     General: Abdomen is flat. Bowel sounds are normal.     Palpations: Abdomen is soft.     Tenderness: There is no abdominal tenderness.  Skin:    Findings: Lesion (tag on anterior neck. 2-3 on lateral neck) present.  Neurological:     Mental Status: She is alert and oriented to person, place, and time.  Psychiatric:        Mood and Affect: Mood normal.        Behavior: Behavior normal.    Diabetic Foot Exam - Simple   Simple Foot Form Diabetic Foot exam was performed with the following findings: Yes 08/21/2020  8:46 AM  Visual Inspection No deformities, no ulcerations, no other skin breakdown bilaterally: Yes Sensation Testing Intact to touch and monofilament testing bilaterally: Yes Pulse Check Posterior Tibialis and Dorsalis pulse intact bilaterally: Yes Comments Varicosities spider.      Lab Results  Component Value Date   WBC 10.4 02/22/2020   HGB 13.8 02/22/2020   HCT 40.7 02/22/2020   PLT 307 02/22/2020   GLUCOSE 116 (H) 02/22/2020   CHOL 163 02/22/2020   TRIG 88 02/22/2020   HDL 64 02/22/2020   LDLCALC 83 02/22/2020   ALT 22 02/22/2020   AST 20 02/22/2020   NA 142 02/22/2020   K 4.7 02/22/2020   CL 104 02/22/2020   CREATININE 0.65 02/22/2020   BUN 11 02/22/2020   CO2 20 02/22/2020   HGBA1C 5.8 (H) 02/22/2020   MICROALBUR 10 02/22/2020      Assessment & Plan:   1. Essential (primary) hypertension Well controlled.  No changes to medicines.  Continue to work on eating a healthy diet and exercise.  Labs drawn today.  - Comprehensive metabolic panel  2. Type 2 diabetes mellitus with other specified complication, with long-term current use of insulin  (HCC) - CBC with Differential/Platelet - Hemoglobin A1c  3. Mixed hyperlipidemia Well controlled.  No changes to medicines.  Continue to work on eating a healthy diet and exercise.  Labs drawn today.  - Lipid panel   4. Morbid obesity with bmi 35 and serious comorbidities.  Recommend continue to work on eating healthy diet and exercise.     Orders Placed This Encounter  Procedures   CBC with Differential/Platelet   Comprehensive metabolic panel   Lipid panel   Hemoglobin A1c      Follow-up: Return in  about 6 months (around 02/21/2021) for fasting cpe.  An After Visit Summary was printed and given to the patient.  Rochel Brome, MD [facility] [phone]

## 2020-08-22 LAB — COMPREHENSIVE METABOLIC PANEL
ALT: 26 IU/L (ref 0–32)
AST: 23 IU/L (ref 0–40)
Albumin/Globulin Ratio: 2.1 (ref 1.2–2.2)
Albumin: 4.9 g/dL (ref 3.8–4.9)
Alkaline Phosphatase: 102 IU/L (ref 44–121)
BUN/Creatinine Ratio: 16 (ref 9–23)
BUN: 11 mg/dL (ref 6–24)
Bilirubin Total: 0.6 mg/dL (ref 0.0–1.2)
CO2: 23 mmol/L (ref 20–29)
Calcium: 9.8 mg/dL (ref 8.7–10.2)
Chloride: 103 mmol/L (ref 96–106)
Creatinine, Ser: 0.69 mg/dL (ref 0.57–1.00)
Globulin, Total: 2.3 g/dL (ref 1.5–4.5)
Glucose: 107 mg/dL — ABNORMAL HIGH (ref 65–99)
Potassium: 4.6 mmol/L (ref 3.5–5.2)
Sodium: 141 mmol/L (ref 134–144)
Total Protein: 7.2 g/dL (ref 6.0–8.5)
eGFR: 100 mL/min/{1.73_m2} (ref >59)

## 2020-08-22 LAB — CBC WITH DIFFERENTIAL/PLATELET
Basophils Absolute: 0.1 10*3/uL (ref 0.0–0.2)
Basos: 1 %
EOS (ABSOLUTE): 0.3 10*3/uL (ref 0.0–0.4)
Eos: 2 %
Hematocrit: 42.2 % (ref 34.0–46.6)
Hemoglobin: 13.8 g/dL (ref 11.1–15.9)
Immature Grans (Abs): 0.1 10*3/uL (ref 0.0–0.1)
Immature Granulocytes: 1 %
Lymphocytes Absolute: 3.2 10*3/uL — ABNORMAL HIGH (ref 0.7–3.1)
Lymphs: 30 %
MCH: 31.9 pg (ref 26.6–33.0)
MCHC: 32.7 g/dL (ref 31.5–35.7)
MCV: 98 fL — ABNORMAL HIGH (ref 79–97)
Monocytes Absolute: 0.6 10*3/uL (ref 0.1–0.9)
Monocytes: 6 %
Neutrophils Absolute: 6.4 10*3/uL (ref 1.4–7.0)
Neutrophils: 60 %
Platelets: 294 10*3/uL (ref 150–450)
RBC: 4.33 x10E6/uL (ref 3.77–5.28)
RDW: 12.5 % (ref 11.7–15.4)
WBC: 10.6 10*3/uL (ref 3.4–10.8)

## 2020-08-22 LAB — HEMOGLOBIN A1C
Est. average glucose Bld gHb Est-mCnc: 120 mg/dL
Hgb A1c MFr Bld: 5.8 % — ABNORMAL HIGH (ref 4.8–5.6)

## 2020-08-22 LAB — LIPID PANEL
Chol/HDL Ratio: 2.4 ratio (ref 0.0–4.4)
Cholesterol, Total: 165 mg/dL (ref 100–199)
HDL: 68 mg/dL (ref >39)
LDL Chol Calc (NIH): 77 mg/dL (ref 0–99)
Triglycerides: 115 mg/dL (ref 0–149)
VLDL Cholesterol Cal: 20 mg/dL (ref 5–40)

## 2020-08-22 LAB — CARDIOVASCULAR RISK ASSESSMENT

## 2020-08-28 ENCOUNTER — Other Ambulatory Visit: Payer: Self-pay | Admitting: Family Medicine

## 2020-08-28 DIAGNOSIS — E7889 Other lipoprotein metabolism disorders: Secondary | ICD-10-CM

## 2020-10-04 ENCOUNTER — Other Ambulatory Visit: Payer: Self-pay | Admitting: Family Medicine

## 2020-10-04 DIAGNOSIS — I10 Essential (primary) hypertension: Secondary | ICD-10-CM

## 2020-10-04 DIAGNOSIS — J410 Simple chronic bronchitis: Secondary | ICD-10-CM

## 2020-10-07 NOTE — Telephone Encounter (Signed)
Refill sent to pharmacy.   

## 2020-11-25 ENCOUNTER — Encounter: Payer: BC Managed Care – PPO | Admitting: Family Medicine

## 2020-12-25 ENCOUNTER — Other Ambulatory Visit: Payer: Self-pay

## 2020-12-25 DIAGNOSIS — Z1211 Encounter for screening for malignant neoplasm of colon: Secondary | ICD-10-CM

## 2020-12-25 NOTE — Progress Notes (Signed)
Cologuard ordered at patient request.   Last done 12/09/16 (Negative)

## 2021-01-09 LAB — COLOGUARD: COLOGUARD: NEGATIVE

## 2021-02-25 ENCOUNTER — Other Ambulatory Visit: Payer: Self-pay

## 2021-02-25 ENCOUNTER — Encounter: Payer: Self-pay | Admitting: Family Medicine

## 2021-02-25 ENCOUNTER — Ambulatory Visit (INDEPENDENT_AMBULATORY_CARE_PROVIDER_SITE_OTHER): Payer: BC Managed Care – PPO | Admitting: Family Medicine

## 2021-02-25 VITALS — BP 116/64 | HR 80 | Temp 97.3°F | Resp 18 | Ht 68.0 in | Wt 242.0 lb

## 2021-02-25 DIAGNOSIS — E1169 Type 2 diabetes mellitus with other specified complication: Secondary | ICD-10-CM | POA: Diagnosis not present

## 2021-02-25 DIAGNOSIS — G4733 Obstructive sleep apnea (adult) (pediatric): Secondary | ICD-10-CM | POA: Diagnosis not present

## 2021-02-25 DIAGNOSIS — I152 Hypertension secondary to endocrine disorders: Secondary | ICD-10-CM

## 2021-02-25 DIAGNOSIS — Z23 Encounter for immunization: Secondary | ICD-10-CM

## 2021-02-25 DIAGNOSIS — B370 Candidal stomatitis: Secondary | ICD-10-CM

## 2021-02-25 DIAGNOSIS — E118 Type 2 diabetes mellitus with unspecified complications: Secondary | ICD-10-CM

## 2021-02-25 DIAGNOSIS — J410 Simple chronic bronchitis: Secondary | ICD-10-CM

## 2021-02-25 DIAGNOSIS — Z0000 Encounter for general adult medical examination without abnormal findings: Secondary | ICD-10-CM | POA: Diagnosis not present

## 2021-02-25 DIAGNOSIS — Z6836 Body mass index (BMI) 36.0-36.9, adult: Secondary | ICD-10-CM

## 2021-02-25 DIAGNOSIS — E782 Mixed hyperlipidemia: Secondary | ICD-10-CM

## 2021-02-25 DIAGNOSIS — R7303 Prediabetes: Secondary | ICD-10-CM | POA: Diagnosis not present

## 2021-02-25 DIAGNOSIS — Z122 Encounter for screening for malignant neoplasm of respiratory organs: Secondary | ICD-10-CM | POA: Insufficient documentation

## 2021-02-25 DIAGNOSIS — E1159 Type 2 diabetes mellitus with other circulatory complications: Secondary | ICD-10-CM

## 2021-02-25 DIAGNOSIS — Z1231 Encounter for screening mammogram for malignant neoplasm of breast: Secondary | ICD-10-CM

## 2021-02-25 DIAGNOSIS — F17218 Nicotine dependence, cigarettes, with other nicotine-induced disorders: Secondary | ICD-10-CM

## 2021-02-25 DIAGNOSIS — E785 Hyperlipidemia, unspecified: Secondary | ICD-10-CM

## 2021-02-25 MED ORDER — NYSTATIN 100000 UNIT/ML MT SUSP: 5.0000 mL | Freq: Four times a day (QID) | OROMUCOSAL | 0 refills | Status: DC

## 2021-02-25 NOTE — Progress Notes (Addendum)
Subjective:  Patient ID: Kathleen Wells, female    DOB: May 14, 1960  Age: 61 y.o. MRN: [id number]  Chief Complaint  Patient presents with   Physical    HPI Well Adult Physical: Patient here for a comprehensive physical exam.The patient reports  tremors of hands Do you take any herbs or supplements that were not prescribed by a doctor?  Multivitamins   Are you taking calcium supplements? no Are you taking aspirin daily? yes  Encounter for general adult medical examination without abnormal findings  Physical ("At Risk" items are starred): Patient's last physical exam was 1 year ago .  Patient is not afflicted from Stress Incontinence and Urge Incontinence  Patient wears a seat belt, has smoke detectors, has carbon monoxide detectors, practices appropriate gun safety, and wears sunscreen with extended sun exposure. Dental Care: biannual cleanings, brushes and flosses daily. Ophthalmology/Optometry: Annual visit.  Hearing loss: none Vision impairments: none PAP: 11/22/2019. NORMAL.    Patient has hyperlipidemia/aortic atherosclerosis (lipitor 20 mg before bed and aspiin 81 mg daily), hypertension (benicar 20 mg daily), and diet controlled diabetes. She checks her feet daily. She eats healthy and exercises. She had her eyes checked in 05/30/2020. Patient has OSA. Wearing cpap. Benefits. Compliant. COPD found on ct scan of lungs last year. On Breztri 2 puffs twice daily.    Flowsheet Row Office Visit from 02/25/2021 in [facility]  PHQ-2 Total Score 0       Current Exercise Habits: Home exercise routine, Type of exercise: walking, Time (Minutes): 20, Frequency (Times/Week): 2, Weekly Exercise (Minutes/Week): 40, Intensity: Mild     Social Hx   Social History   Socioeconomic History   Marital status: Media planner    Spouse name: Not on file   Number of children: 0   Years of education: Not on file   Highest education level: Not on file  Occupational History    Occupation: OFFICE  Tobacco Use   Smoking status: Every Day    Packs/day: 0.50    Years: 42.00    Pack years: 21.00    Types: Cigarettes   Smokeless tobacco: Never  Vaping Use   Vaping Use: Never used  Substance and Sexual Activity   Alcohol use: Yes    Alcohol/week: 8.0 standard drinks    Types: 4 Cans of beer, 4 Standard drinks or equivalent per week   Drug use: Never   Sexual activity: Not Currently  Other Topics Concern   Not on file  Social History Narrative   Not on file   Social Determinants of Health   Financial Resource Strain: Low Risk    Difficulty of Paying Living Expenses: Not hard at all  Food Insecurity: No Food Insecurity   Worried About Programme researcher, broadcasting/film/video in the Last Year: Never true   Ran Out of Food in the Last Year: Never true  Transportation Needs: Unknown   Lack of Transportation (Medical): No   Lack of Transportation (Non-Medical): Not on file  Physical Activity: Insufficiently Active   Days of Exercise per Week: 2 days   Minutes of Exercise per Session: 20 min  Stress: No Stress Concern Present   Feeling of Stress : Not at all  Social Connections: Unknown   Frequency of Communication with Friends and Family: More than three times a week   Frequency of Social Gatherings with Friends and Family: Three times a week   Attends Religious Services: Not on file   Active Member of Clubs or Organizations: Not  on file   Attends Club or Organization Meetings: Not on file   Marital Status: Living with partner   Past Medical History:  Diagnosis Date   Essential (primary) hypertension    Mixed hyperlipidemia    Obstructive sleep apnea (adult) (pediatric)    Type 2 diabetes mellitus with other specified complication ([place])    History reviewed. No pertinent surgical history.  Family History  Problem Relation Age of Onset   Lung cancer Mother    Congestive Heart Failure Father    Lupus Sister    Hepatitis C Brother    Stomach cancer Maternal Grandmother      Review of Systems  Constitutional:  Negative for appetite change, fatigue and fever.  HENT:  Negative for congestion, ear pain, sinus pressure and sore throat.   Eyes:  Negative for pain.  Respiratory:  Negative for cough, chest tightness, shortness of breath and wheezing.   Cardiovascular:  Negative for chest pain and palpitations.  Gastrointestinal:  Negative for abdominal pain, constipation, diarrhea, nausea and vomiting.  Genitourinary:  Negative for dysuria and hematuria.  Musculoskeletal:  Negative for arthralgias, back pain, joint swelling and myalgias.  Skin:  Negative for rash.  Neurological:  Positive for tremors (hands). Negative for dizziness, weakness and headaches.  Psychiatric/Behavioral:  Negative for dysphoric mood. The patient is not nervous/anxious.     Objective:  BP 116/64    Pulse 80    Temp (!) 97.3 F (36.3 C)    Resp 18    Ht 5\' 8"  (1.727 m)    Wt 242 lb (109.8 kg)    BMI 36.80 kg/m   BP/Weight 02/25/2021 08/21/2020 [id number]  Systolic BP 99991111 0000000 [id number]  Diastolic BP 64 80 82  Wt. (Lbs) 242 243 244  BMI 36.8 35.88 36.03    Physical Exam Vitals reviewed.  Constitutional:      General: She is not in acute distress.    Appearance: Normal appearance. She is obese.  HENT:     Right Ear: Tympanic membrane and ear canal normal.     Left Ear: Tympanic membrane and ear canal normal.     Nose: Nose normal. No congestion or rhinorrhea.     Mouth/Throat:     Pharynx: No oropharyngeal exudate or posterior oropharyngeal erythema.     Comments: Thrush posterior palate. Small area.  Eyes:     Conjunctiva/sclera: Conjunctivae normal.  Neck:     Thyroid: No thyroid mass.     Vascular: No carotid bruit.  Cardiovascular:     Rate and Rhythm: Normal rate and regular rhythm.     Pulses: Normal pulses.     Heart sounds: Normal heart sounds. No murmur heard. Pulmonary:     Effort: Pulmonary effort is normal. No respiratory distress.     Breath sounds: Normal breath  sounds.  Abdominal:     General: Abdomen is flat. Bowel sounds are normal.     Palpations: Abdomen is soft. There is no mass.     Tenderness: There is no abdominal tenderness.  Lymphadenopathy:     Cervical: No cervical adenopathy.  Skin:    Findings: No lesion or rash.  Neurological:     Mental Status: She is alert and oriented to person, place, and time.  Psychiatric:        Mood and Affect: Mood normal.        Behavior: Behavior normal.    Lab Results  Component Value Date   WBC 10.8 02/25/2021   HGB 14.7  02/25/2021   HCT 44.4 02/25/2021   PLT 326 02/25/2021   GLUCOSE 115 (H) 02/25/2021   CHOL 181 02/25/2021   TRIG 67 02/25/2021   HDL 77 02/25/2021   LDLCALC 91 02/25/2021   ALT 30 02/25/2021   AST 25 02/25/2021   NA 140 02/25/2021   K 5.0 02/25/2021   CL 102 02/25/2021   CREATININE 0.75 02/25/2021   BUN 10 02/25/2021   CO2 24 02/25/2021   HGBA1C 5.8 (H) 02/25/2021   MICROALBUR 10 02/22/2020      Assessment & Plan:   Problem List Items Addressed This Visit       Cardiovascular and Mediastinum   Hypertension associated with diabetes ([place])    Well controlled.  No changes to medicines.  Continue to work on eating a healthy diet and exercise.  Labs drawn today.          Respiratory   Obstructive sleep apnea (adult) (pediatric)    Continue cpap.       Simple chronic bronchitis (HCC)    Continue breztri 2 puffs twice daily.          Digestive   Thrush    Nystatin      Relevant Medications   nystatin (MYCOSTATIN) 100000 UNIT/ML suspension     Endocrine   Hyperlipidemia associated with type 2 diabetes mellitus (HCC)    Control: good Recommend check feet daily. Recommend annual eye exams. Medicines: none Continue to work on eating a healthy diet and exercise.  Labs drawn today.          Relevant Orders   Hemoglobin A1c (Completed)   Microalbumin / creatinine urine ratio (Completed)     Other   Mixed hyperlipidemia    Well  controlled.  No changes to medicines.  Continue to work on eating a healthy diet and exercise.  Labs drawn today.        Nicotine dependence    Recommend cessation.      Encounter for general adult medical examination without abnormal findings - Primary    Things to do to keep yourself healthy  - Exercise at least 30-45 minutes a day, 3-4 days a week.  - Eat a low-fat diet with lots of fruits and vegetables, up to 7-9 servings per day.  - Seatbelts can save your life. Wear them always.  - Smoke detectors on every level of your home, check batteries every year.  - Eye Doctor - have an eye exam every 1-2 years  - Safe sex - if you may be exposed to STDs, use a condom.  - Alcohol -  If you drink, do it moderately, less than 2 drinks per day.  - [place]. Choose someone to speak for you if you are not able.  - Depression is common in our stressful world.If you're feeling down or losing interest in things you normally enjoy, please come in for a visit.  - Violence - If anyone is threatening or hurting you, please call immediately.       Relevant Orders   CBC With Diff/Platelet (Completed)   Comprehensive metabolic panel (Completed)   Lipid panel (Completed)   Class 2 severe obesity due to excess calories with serious comorbidity and body mass index (BMI) of 36.0 to 36.9 in adult [facility])    Recommend continue to work on eating healthy diet and exercise. Associated comorbidities: diabetes and hyperlipidemia.      Screening for lung cancer    Ordered CT LUNG  SCREENING.      Relevant Orders   CT CHEST LUNG CANCER SCREENING LOW DOSE WO CONTRAST   Encounter for screening mammogram for breast cancer    ORDERED SCREENING MAMMOGRAM      Relevant Orders   MM DIGITAL SCREENING BILATERAL   Need for vaccination    GIVEN SHINGRIX VACCINE.      Relevant Orders   Varicella-zoster vaccine IM (Shingrix) (Completed)      Body mass index is 36.8 kg/m.   This is a  list of the screening recommended for you and due dates:  Health Maintenance  Topic Date Due   Mammogram  01/11/2021   Zoster (Shingles) Vaccine (2 of 2) 04/22/2021   Eye exam for diabetics  05/30/2021   Complete foot exam   08/21/2021   Hemoglobin A1C  08/25/2021   Cologuard (Stool DNA test)  12/31/2023   Tetanus Vaccine  03/11/2024   Pap Smear  11/21/2024   Flu Shot  Completed   COVID-19 Vaccine  Completed   Hepatitis C Screening: USPSTF Recommendation to screen - Ages 18-79 yo.  Completed   HIV Screening  Completed   HPV Vaccine  Aged Out     Meds ordered this encounter  Medications   nystatin (MYCOSTATIN) 100000 UNIT/ML suspension    Sig: Use as directed 5 mLs (500,000 Units total) in the mouth or throat 4 (four) times daily.    Dispense:  60 mL    Refill:  0    Follow-up: Return in about 6 months (around 08/25/2021) for chronic fasting, Nurse visit in 2 months for shingrix. Marland Kitchen  An After Visit Summary was printed and given to the patient.   I,Lauren M Auman,acting as a scribe for Rochel Brome, MD.,have documented all relevant documentation on the behalf of Rochel Brome, MD,as directed by  Rochel Brome, MD while in the presence of Rochel Brome, MD.    Rochel Brome, MD [place] [phone]

## 2021-02-25 NOTE — Patient Instructions (Signed)

## 2021-02-26 LAB — LIPID PANEL
Chol/HDL Ratio: 2.4 ratio (ref 0.0–4.4)
Cholesterol, Total: 181 mg/dL (ref 100–199)
HDL: 77 mg/dL (ref >39)
LDL Chol Calc (NIH): 91 mg/dL (ref 0–99)
Triglycerides: 67 mg/dL (ref 0–149)
VLDL Cholesterol Cal: 13 mg/dL (ref 5–40)

## 2021-02-26 LAB — COMPREHENSIVE METABOLIC PANEL
ALT: 30 IU/L (ref 0–32)
AST: 25 IU/L (ref 0–40)
Albumin/Globulin Ratio: 2.3 — ABNORMAL HIGH (ref 1.2–2.2)
Albumin: 5 g/dL — ABNORMAL HIGH (ref 3.8–4.9)
Alkaline Phosphatase: 116 IU/L (ref 44–121)
BUN/Creatinine Ratio: 13 (ref 12–28)
BUN: 10 mg/dL (ref 8–27)
Bilirubin Total: 0.7 mg/dL (ref 0.0–1.2)
CO2: 24 mmol/L (ref 20–29)
Calcium: 10.2 mg/dL (ref 8.7–10.3)
Chloride: 102 mmol/L (ref 96–106)
Creatinine, Ser: 0.75 mg/dL (ref 0.57–1.00)
Globulin, Total: 2.2 g/dL (ref 1.5–4.5)
Glucose: 115 mg/dL — ABNORMAL HIGH (ref 70–99)
Potassium: 5 mmol/L (ref 3.5–5.2)
Sodium: 140 mmol/L (ref 134–144)
Total Protein: 7.2 g/dL (ref 6.0–8.5)
eGFR: 91 mL/min/{1.73_m2} (ref >59)

## 2021-02-26 LAB — CBC WITH DIFF/PLATELET
Basophils Absolute: 0.1 10*3/uL (ref 0.0–0.2)
Basos: 1 %
EOS (ABSOLUTE): 0.1 10*3/uL (ref 0.0–0.4)
Eos: 1 %
Hematocrit: 44.4 % (ref 34.0–46.6)
Hemoglobin: 14.7 g/dL (ref 11.1–15.9)
Immature Grans (Abs): 0.1 10*3/uL (ref 0.0–0.1)
Immature Granulocytes: 1 %
Lymphocytes Absolute: 2.9 10*3/uL (ref 0.7–3.1)
Lymphs: 27 %
MCH: 32.2 pg (ref 26.6–33.0)
MCHC: 33.1 g/dL (ref 31.5–35.7)
MCV: 97 fL (ref 79–97)
Monocytes Absolute: 0.7 10*3/uL (ref 0.1–0.9)
Monocytes: 6 %
Neutrophils Absolute: 7 10*3/uL (ref 1.4–7.0)
Neutrophils: 64 %
Platelets: 326 10*3/uL (ref 150–450)
RBC: 4.56 x10E6/uL (ref 3.77–5.28)
RDW: 12.5 % (ref 11.7–15.4)
WBC: 10.8 10*3/uL (ref 3.4–10.8)

## 2021-02-26 LAB — HEMOGLOBIN A1C
Est. average glucose Bld gHb Est-mCnc: 120 mg/dL
Hgb A1c MFr Bld: 5.8 % — ABNORMAL HIGH (ref 4.8–5.6)

## 2021-02-26 LAB — MICROALBUMIN / CREATININE URINE RATIO
Creatinine, Urine: 27.5 mg/dL
Microalb/Creat Ratio: <11 mg/g creat (ref 0–29)
Microalbumin, Urine: <3 ug/mL

## 2021-02-26 NOTE — Progress Notes (Signed)
Blood count normal.  Liver function normal.  Kidney function normal.  Cholesterol: good HBA1C: 5.8. Not spilling protein in urine.

## 2021-02-27 DIAGNOSIS — Z1231 Encounter for screening mammogram for malignant neoplasm of breast: Secondary | ICD-10-CM | POA: Insufficient documentation

## 2021-02-27 DIAGNOSIS — Z23 Encounter for immunization: Secondary | ICD-10-CM | POA: Insufficient documentation

## 2021-02-27 NOTE — Assessment & Plan Note (Signed)
LABS DRAWN

## 2021-02-27 NOTE — Assessment & Plan Note (Signed)
GIVEN SHINGRIX VACCINE.

## 2021-02-27 NOTE — Assessment & Plan Note (Addendum)
Control: good ?Recommend check feet daily. ?Recommend annual eye exams. ?Medicines: none ?Continue to work on eating a healthy diet and exercise.  ?Labs drawn today.   ? ?

## 2021-02-27 NOTE — Assessment & Plan Note (Signed)
Ordered CT LUNG SCREENING.  

## 2021-02-27 NOTE — Assessment & Plan Note (Signed)
ORDERED SCREENING MAMMOGRAM

## 2021-03-02 ENCOUNTER — Encounter: Payer: Self-pay | Admitting: Family Medicine

## 2021-03-02 DIAGNOSIS — B370 Candidal stomatitis: Secondary | ICD-10-CM | POA: Insufficient documentation

## 2021-03-02 DIAGNOSIS — J410 Simple chronic bronchitis: Secondary | ICD-10-CM | POA: Insufficient documentation

## 2021-03-02 DIAGNOSIS — E66812 Obesity, class 2: Secondary | ICD-10-CM | POA: Insufficient documentation

## 2021-03-02 HISTORY — DX: Simple chronic bronchitis: J41.0

## 2021-03-02 NOTE — Assessment & Plan Note (Signed)
Recommend continue to work on eating healthy diet and exercise. Associated comorbidities: diabetes and hyperlipidemia.

## 2021-03-02 NOTE — Assessment & Plan Note (Signed)
Well controlled.  ?No changes to medicines.  ?Continue to work on eating a healthy diet and exercise.  ?Labs drawn today.  ?

## 2021-03-02 NOTE — Assessment & Plan Note (Signed)
Continue cpap.  

## 2021-03-02 NOTE — Assessment & Plan Note (Signed)
Continue breztri 2 puffs twice daily.  ?

## 2021-03-02 NOTE — Assessment & Plan Note (Signed)

## 2021-03-02 NOTE — Assessment & Plan Note (Signed)
Recommend cessation. ?

## 2021-03-02 NOTE — Assessment & Plan Note (Signed)
Nystatin

## 2021-03-08 ENCOUNTER — Other Ambulatory Visit: Payer: Self-pay | Admitting: Family Medicine

## 2021-03-08 DIAGNOSIS — J410 Simple chronic bronchitis: Secondary | ICD-10-CM

## 2021-03-12 ENCOUNTER — Ambulatory Visit (HOSPITAL_COMMUNITY)
Admission: RE | Admit: 2021-03-12 | Discharge: 2021-03-12 | Disposition: A | Discharge status: Home / Self Care | Payer: BC Managed Care – PPO | Source: Ambulatory Visit | Attending: Family Medicine | Admitting: Family Medicine

## 2021-03-12 ENCOUNTER — Encounter (HOSPITAL_COMMUNITY): Payer: Self-pay

## 2021-03-12 ENCOUNTER — Other Ambulatory Visit: Payer: Self-pay

## 2021-03-12 DIAGNOSIS — Z122 Encounter for screening for malignant neoplasm of respiratory organs: Secondary | ICD-10-CM | POA: Diagnosis not present

## 2021-03-23 ENCOUNTER — Other Ambulatory Visit: Payer: Self-pay | Admitting: Family Medicine

## 2021-03-23 DIAGNOSIS — I10 Essential (primary) hypertension: Secondary | ICD-10-CM

## 2021-03-23 DIAGNOSIS — E7889 Other lipoprotein metabolism disorders: Secondary | ICD-10-CM

## 2021-03-25 ENCOUNTER — Other Ambulatory Visit: Payer: Self-pay

## 2021-03-25 DIAGNOSIS — E1169 Type 2 diabetes mellitus with other specified complication: Secondary | ICD-10-CM

## 2021-03-25 MED ORDER — ATORVASTATIN CALCIUM 40 MG PO TABS: 40.0000 mg | ORAL_TABLET | Freq: Every day | ORAL | 1 refills | Status: DC

## 2021-03-26 ENCOUNTER — Other Ambulatory Visit: Payer: Self-pay | Admitting: Family Medicine

## 2021-03-26 DIAGNOSIS — Z1231 Encounter for screening mammogram for malignant neoplasm of breast: Secondary | ICD-10-CM

## 2021-04-02 ENCOUNTER — Other Ambulatory Visit: Payer: Self-pay

## 2021-04-02 ENCOUNTER — Ambulatory Visit
Admission: RE | Admit: 2021-04-02 | Discharge: 2021-04-02 | Disposition: A | Discharge status: Home / Self Care | Payer: BC Managed Care – PPO | Source: Ambulatory Visit | Attending: Family Medicine | Admitting: Family Medicine

## 2021-04-02 DIAGNOSIS — Z1231 Encounter for screening mammogram for malignant neoplasm of breast: Secondary | ICD-10-CM

## 2021-04-25 ENCOUNTER — Ambulatory Visit (INDEPENDENT_AMBULATORY_CARE_PROVIDER_SITE_OTHER): Payer: BC Managed Care – PPO

## 2021-04-25 DIAGNOSIS — Z23 Encounter for immunization: Secondary | ICD-10-CM | POA: Diagnosis not present

## 2021-06-17 ENCOUNTER — Ambulatory Visit: Payer: BC Managed Care – PPO | Admitting: Family Medicine

## 2021-06-17 ENCOUNTER — Encounter: Payer: Self-pay | Admitting: Family Medicine

## 2021-06-17 VITALS — BP 124/64 | HR 88 | Temp 97.6°F | Resp 18 | Ht 68.0 in | Wt 240.0 lb

## 2021-06-17 DIAGNOSIS — J441 Chronic obstructive pulmonary disease with (acute) exacerbation: Secondary | ICD-10-CM

## 2021-06-17 MED ORDER — TRIAMCINOLONE ACETONIDE 40 MG/ML IJ SUSP
80.0000 mg | Freq: Once | INTRAMUSCULAR | Status: AC
  Administered 2021-06-17: 80 mg via INTRAMUSCULAR

## 2021-06-17 MED ORDER — ALBUTEROL SULFATE HFA 108 (90 BASE) MCG/ACT IN AERS
2.0000 | INHALATION_SPRAY | Freq: Four times a day (QID) | RESPIRATORY_TRACT | 2 refills | Status: DC | PRN
Start: 2021-06-17 — End: 2022-04-29

## 2021-06-17 MED ORDER — AZITHROMYCIN 250 MG PO TABS: ORAL_TABLET | ORAL | 0 refills | Status: DC

## 2021-06-17 NOTE — Progress Notes (Signed)
Acute Office Visit  Subjective:    Patient ID: Kathleen Wells, female    DOB: 05-14-1960, 61 y.o.   MRN: [id number]  Chief Complaint  Patient presents with   Cough    HPI: Patient is in today for chest congestion and cough which started 2 weeks ago.  She denies fever or chills.  She has been taking diabetic tussin and zyrtec with no improvement.    [place]: ON CPAP. Has been wearing it.   Past Medical History:  Diagnosis Date   Essential (primary) hypertension    Mixed hyperlipidemia    Obstructive sleep apnea (adult) (pediatric)    Type 2 diabetes mellitus with other specified complication ([place])     History reviewed. No pertinent surgical history.  Family History  Problem Relation Age of Onset   Lung cancer Mother    Congestive Heart Failure Father    Lupus Sister    Stomach cancer Maternal Grandmother    Hepatitis C Brother    Breast cancer Neg Hx     Social History   Socioeconomic History   Marital status: Soil scientist    Spouse name: Not on file   Number of children: 0   Years of education: Not on file   Highest education level: Not on file  Occupational History   Occupation: OFFICE  Tobacco Use   Smoking status: Every Day    Packs/day: 0.50    Years: 42.00    Total pack years: 21.00    Types: Cigarettes   Smokeless tobacco: Never  Vaping Use   Vaping Use: Never used  Substance and Sexual Activity   Alcohol use: Yes    Alcohol/week: 8.0 standard drinks of alcohol    Types: 4 Cans of beer, 4 Standard drinks or equivalent per week   Drug use: Never   Sexual activity: Not Currently  Other Topics Concern   Not on file  Social History Narrative   Not on file   Social Determinants of Health   Financial Resource Strain: Low Risk  (03/02/2021)   Overall Financial Resource Strain (CARDIA)    Difficulty of Paying Living Expenses: Not hard at all  Food Insecurity: No Food Insecurity (03/02/2021)   Hunger Vital Sign    Worried About Running Out of Food  in the Last Year: Never true    Ran Out of Food in the Last Year: Never true  Transportation Needs: Unknown (03/02/2021)   PRAPARE - Hydrologist (Medical): No    Lack of Transportation (Non-Medical): Not on file  Physical Activity: Insufficiently Active (03/02/2021)   Exercise Vital Sign    Days of Exercise per Week: 2 days    Minutes of Exercise per Session: 20 min  Stress: No Stress Concern Present (03/02/2021)   [place]    Feeling of Stress : Not at all  Social Connections: Unknown (03/02/2021)   Social Connection and Isolation Panel [NHANES]    Frequency of Communication with Friends and Family: More than three times a week    Frequency of Social Gatherings with Friends and Family: Three times a week    Attends Religious Services: Not on file    Active Member of Clubs or Organizations: Not on file    Attends Club or Organization Meetings: Not on file    Marital Status: Living with partner  Intimate Partner Violence: Not At Risk (11/22/2019)   Humiliation, Afraid, Rape, and Kick questionnaire  Fear of Current or Ex-Partner: No    Emotionally Abused: No    Physically Abused: No    Sexually Abused: No    Outpatient Medications Prior to Visit  Medication Sig Dispense Refill   ACCU-CHEK GUIDE test strip USE TO TEST UP TO 4 TIMES DAILY AS DIRECTED (Patient not taking: Reported on 02/25/2021) 100 strip 2   aspirin 81 MG chewable tablet Chew 81 mg by mouth daily.     atorvastatin (LIPITOR) 40 MG tablet Take 1 tablet (40 mg total) by mouth daily. 90 tablet 1   BREZTRI AEROSPHERE 160-9-4.8 MCG/ACT AERO INHALE 2 PUFFS INTO THE LUNGS IN THE MORNING AND AT BEDTIME. 10.7 g 11   nystatin (MYCOSTATIN) 100000 UNIT/ML suspension Use as directed 5 mLs (500,000 Units total) in the mouth or throat 4 (four) times daily. 60 mL 0   olmesartan (BENICAR) 20 MG tablet TAKE 1 TABLET BY MOUTH EVERY DAY 90 tablet  1   No facility-administered medications prior to visit.    Allergies  Allergen Reactions   Lisinopril Diarrhea   Septra [Sulfamethoxazole-Trimethoprim] Hives   Sulfa Antibiotics Hives    Review of Systems  Constitutional:  Positive for fatigue. Negative for chills and fever.  HENT:  Positive for congestion and voice change (hoarseness).   Respiratory:  Positive for cough, shortness of breath and wheezing.   Cardiovascular:  Negative for chest pain.       Objective:    Physical Exam Vitals reviewed.  Constitutional:      Appearance: Normal appearance.  HENT:     Right Ear: Tympanic membrane, ear canal and external ear normal.     Left Ear: Tympanic membrane, ear canal and external ear normal.     Nose: Nose normal.     Mouth/Throat:     Pharynx: Oropharynx is clear. Posterior oropharyngeal erythema present.  Cardiovascular:     Rate and Rhythm: Normal rate and regular rhythm.     Heart sounds: Normal heart sounds. No murmur heard. Pulmonary:     Effort: Pulmonary effort is normal. No respiratory distress.     Breath sounds: Wheezing present.  Lymphadenopathy:     Cervical: No cervical adenopathy.  Neurological:     Mental Status: She is alert and oriented to person, place, and time.  Psychiatric:        Mood and Affect: Mood normal.        Behavior: Behavior normal.    BP 124/64   Pulse 88   Temp 97.6 F (36.4 C)   Resp 18   Ht '5\' 8"'  (1.727 m)   Wt 240 lb (108.9 kg)   BMI 36.49 kg/m  Wt Readings from Last 3 Encounters:  06/17/21 240 lb (108.9 kg)  02/25/21 242 lb (109.8 kg)  08/21/20 243 lb (110.2 kg)    Health Maintenance Due  Topic Date Due   OPHTHALMOLOGY EXAM  05/30/2021    There are no preventive care reminders to display for this patient.   No results found for: "TSH" Lab Results  Component Value Date   WBC 10.8 02/25/2021   HGB 14.7 02/25/2021   HCT 44.4 02/25/2021   MCV 97 02/25/2021   PLT 326 02/25/2021   Lab Results  Component  Value Date   NA 140 02/25/2021   K 5.0 02/25/2021   CO2 24 02/25/2021   GLUCOSE 115 (H) 02/25/2021   BUN 10 02/25/2021   CREATININE 0.75 02/25/2021   BILITOT 0.7 02/25/2021   ALKPHOS 116 02/25/2021  AST 25 02/25/2021   ALT 30 02/25/2021   PROT 7.2 02/25/2021   ALBUMIN 5.0 (H) 02/25/2021   CALCIUM 10.2 02/25/2021   EGFR 91 02/25/2021   Lab Results  Component Value Date   CHOL 181 02/25/2021   Lab Results  Component Value Date   HDL 77 02/25/2021   Lab Results  Component Value Date   LDLCALC 91 02/25/2021   Lab Results  Component Value Date   TRIG 67 02/25/2021   Lab Results  Component Value Date   CHOLHDL 2.4 02/25/2021   Lab Results  Component Value Date   HGBA1C 5.8 (H) 02/25/2021       Assessment & Plan:   Problem List Items Addressed This Visit       Respiratory   COPD exacerbation ([place]) - Primary    Zpack. Kenalog shot.  Albuterol 2 puffs four times a day as needed dypsnea.      Relevant Medications   albuterol (VENTOLIN HFA) 108 (90 Base) MCG/ACT inhaler   azithromycin (ZITHROMAX) 250 MG tablet   Meds ordered this encounter  Medications   albuterol (VENTOLIN HFA) 108 (90 Base) MCG/ACT inhaler    Sig: Inhale 2 puffs into the lungs every 6 (six) hours as needed for wheezing or shortness of breath.    Dispense:  8 g    Refill:  2   triamcinolone acetonide (KENALOG-40) injection 80 mg   azithromycin (ZITHROMAX) 250 MG tablet    Sig: 2 DAILY FOR FIRST DAY, THEN DECREASE TO ONE DAILY FOR 4 MORE DAYS.    Dispense:  6 tablet    Refill:  0    Follow-up: Return if symptoms worsen or fail to improve.  An After Visit Summary was printed and given to the patient.  Rochel Brome, MD [facility] [phone]

## 2021-06-21 DIAGNOSIS — J441 Chronic obstructive pulmonary disease with (acute) exacerbation: Secondary | ICD-10-CM | POA: Insufficient documentation

## 2021-06-21 NOTE — Assessment & Plan Note (Signed)
Zpack. Kenalog shot.  Albuterol 2 puffs four times a day as needed dypsnea.

## 2021-08-25 ENCOUNTER — Other Ambulatory Visit: Payer: Self-pay | Admitting: Family Medicine

## 2021-08-25 DIAGNOSIS — E1169 Type 2 diabetes mellitus with other specified complication: Secondary | ICD-10-CM

## 2021-08-25 NOTE — Progress Notes (Unsigned)
Subjective:  Patient ID: Kathleen Wells, female    DOB: [birth year]  Age: 61 y.o. MRN: [id number]  No chief complaint on file.   HPI  Diabetes:  Complications:none Glucose checking: no Hypoglycemia: sometimes feels like it when she misses meals. Most recent A1C: 5.8. Current medications:  Last Eye Exam: 2022. Foot checks: daily Lifestyle changes: eats healthy. Active.    Hyperlipidemia: Current medications: on lipitor 20 daily.   Hypertension: Current medications: benicar 20 mg once daily and aspirin 81 mg once daily.  Diabetes Mellitus Type II, Follow-up  Lab Results  Component Value Date   HGBA1C 5.8 (H) 02/25/2021   HGBA1C 5.8 (H) 08/21/2020   HGBA1C 5.8 (H) 02/22/2020   Wt Readings from Last 3 Encounters:  06/17/21 240 lb (108.9 kg)  02/25/21 242 lb (109.8 kg)  08/21/20 243 lb (110.2 kg)   Last seen for diabetes {1-12:18279} {days/wks/mos/yrs:310907} ago.  Management  includes ***. She reports {excellent/good/fair/poor:19665} compliance with treatment. She {is/is not:21021397} having side effects. {document side effects if present:1}  Home blood sugar records: {diabetes glucometry results:16657}  Episodes of hypoglycemia? {Yes/No:20286} {enter symptoms and frequency of symptoms if yes:1}   Current insulin regiment: {enter 'none' or type of insulin and number of units taken with each dose of each insulin formulation that the patient is taking:1} Most Recent Eye Exam: *** {Current exercise:16438:::1} {Current diet habits:16563:::1}  Pertinent Labs: Lab Results  Component Value Date   CHOL 181 02/25/2021   HDL 77 02/25/2021   LDLCALC 91 02/25/2021   TRIG 67 02/25/2021   CHOLHDL 2.4 02/25/2021   Lab Results  Component Value Date   NA 140 02/25/2021   K 5.0 02/25/2021   CREATININE 0.75 02/25/2021   EGFR 91 02/25/2021   GFRNONAA 97 02/22/2020   GLUCOSE 115 (H) 02/25/2021      She was last seen for hypertension 1 years ago.  BP at that visit was  128/80. Management includes olmesartan.  She reports excellent compliance with treatment. She is not having side effects.  She is following a Regular diet. She {is/is not:9024} exercising. She {does/does not:200015} smoke.    Pertinent labs: Lab Results  Component Value Date   CHOL 181 02/25/2021   HDL 77 02/25/2021   LDLCALC 91 02/25/2021   TRIG 67 02/25/2021   CHOLHDL 2.4 02/25/2021   Lab Results  Component Value Date   NA 140 02/25/2021   K 5.0 02/25/2021   CREATININE 0.75 02/25/2021   EGFR 91 02/25/2021   GFRNONAA 97 02/22/2020   GLUCOSE 115 (H) 02/25/2021      Lipid/Cholesterol, Follow-up  Last lipid panel Other pertinent labs  Lab Results  Component Value Date   CHOL 181 02/25/2021   HDL 77 02/25/2021   LDLCALC 91 02/25/2021   TRIG 67 02/25/2021   CHOLHDL 2.4 02/25/2021   Lab Results  Component Value Date   ALT 30 02/25/2021   AST 25 02/25/2021   PLT 326 02/25/2021     She was last seen for this 1 years ago.  Management includes lipitor.  She reports excellent compliance with treatment. She is not having side effects.   Current diet: {diet habits:16563} Current exercise: {exercise types:16438}   Current Outpatient Medications on File Prior to Visit  Medication Sig Dispense Refill   ACCU-CHEK GUIDE test strip USE TO TEST UP TO 4 TIMES DAILY AS DIRECTED (Patient not taking: Reported on 02/25/2021) 100 strip 2   albuterol (VENTOLIN HFA) 108 (90 Base) MCG/ACT inhaler Inhale 2 puffs into  the lungs every 6 (six) hours as needed for wheezing or shortness of breath. 8 g 2   aspirin 81 MG chewable tablet Chew 81 mg by mouth daily.     atorvastatin (LIPITOR) 40 MG tablet TAKE 1 TABLET BY MOUTH EVERY DAY 90 tablet 1   azithromycin (ZITHROMAX) 250 MG tablet 2 DAILY FOR FIRST DAY, THEN DECREASE TO ONE DAILY FOR 4 MORE DAYS. 6 tablet 0   BREZTRI AEROSPHERE 160-9-4.8 MCG/ACT AERO INHALE 2 PUFFS INTO THE LUNGS IN THE MORNING AND AT BEDTIME. 10.7 g 11   nystatin  (MYCOSTATIN) 100000 UNIT/ML suspension Use as directed 5 mLs (500,000 Units total) in the mouth or throat 4 (four) times daily. 60 mL 0   olmesartan (BENICAR) 20 MG tablet TAKE 1 TABLET BY MOUTH EVERY DAY 90 tablet 1   No current facility-administered medications on file prior to visit.   Past Medical History:  Diagnosis Date   Essential (primary) hypertension    Mixed hyperlipidemia    Obstructive sleep apnea (adult) (pediatric)    Type 2 diabetes mellitus with other specified complication (HCC)    No past surgical history on file.  Family History  Problem Relation Age of Onset   Lung cancer Mother    Congestive Heart Failure Father    Lupus Sister    Stomach cancer Maternal Grandmother    Hepatitis C Brother    Breast cancer Neg Hx    Social History   Socioeconomic History   Marital status: Soil scientist    Spouse name: Not on file   Number of children: 0   Years of education: Not on file   Highest education level: Not on file  Occupational History   Occupation: OFFICE  Tobacco Use   Smoking status: Every Day    Packs/day: 0.50    Years: 42.00    Total pack years: 21.00    Types: Cigarettes   Smokeless tobacco: Never  Vaping Use   Vaping Use: Never used  Substance and Sexual Activity   Alcohol use: Yes    Alcohol/week: 8.0 standard drinks of alcohol    Types: 4 Cans of beer, 4 Standard drinks or equivalent per week   Drug use: Never   Sexual activity: Not Currently  Other Topics Concern   Not on file  Social History Narrative   Not on file   Social Determinants of Health   Financial Resource Strain: Low Risk  (03/02/2021)   Overall Financial Resource Strain (CARDIA)    Difficulty of Paying Living Expenses: Not hard at all  Food Insecurity: No Food Insecurity (03/02/2021)   Hunger Vital Sign    Worried About Running Out of Food in the Last Year: Never true    Ran Out of Food in the Last Year: Never true  Transportation Needs: Unknown (03/02/2021)    PRAPARE - Hydrologist (Medical): No    Lack of Transportation (Non-Medical): Not on file  Physical Activity: Insufficiently Active (03/02/2021)   Exercise Vital Sign    Days of Exercise per Week: 2 days    Minutes of Exercise per Session: 20 min  Stress: No Stress Concern Present (03/02/2021)   [place]    Feeling of Stress : Not at all  Social Connections: Unknown (03/02/2021)   Social Connection and Isolation Panel [NHANES]    Frequency of Communication with Friends and Family: More than three times a week    Frequency of  Social Gatherings with Friends and Family: Three times a week    Attends Religious Services: Not on file    Active Member of Clubs or Organizations: Not on file    Attends Club or Organization Meetings: Not on file    Marital Status: Living with partner    Review of Systems  Constitutional:  Negative for chills, fatigue and fever.  HENT:  Negative for congestion, ear pain, rhinorrhea and sore throat.   Respiratory:  Negative for cough and shortness of breath.   Cardiovascular:  Negative for chest pain.  Gastrointestinal:  Negative for abdominal pain, constipation, diarrhea, nausea and vomiting.  Genitourinary:  Negative for dysuria and urgency.  Musculoskeletal:  Negative for back pain and myalgias.  Neurological:  Negative for dizziness, weakness, light-headedness and headaches.  Psychiatric/Behavioral:  Negative for dysphoric mood. The patient is not nervous/anxious.      Objective:  There were no vitals taken for this visit.     06/17/2021   11:30 AM 02/25/2021    7:11 PM 08/21/2020    8:14 AM  BP/Weight  Systolic BP 161 096 045  Diastolic BP 64 64 80  Wt. (Lbs) 240 242 243  BMI 36.49 kg/m2 36.8 kg/m2 35.88 kg/m2    Physical Exam  Diabetic Foot Exam - Simple   No data filed      Lab Results  Component Value Date   WBC 10.8 02/25/2021   HGB 14.7  02/25/2021   HCT 44.4 02/25/2021   PLT 326 02/25/2021   GLUCOSE 115 (H) 02/25/2021   CHOL 181 02/25/2021   TRIG 67 02/25/2021   HDL 77 02/25/2021   LDLCALC 91 02/25/2021   ALT 30 02/25/2021   AST 25 02/25/2021   NA 140 02/25/2021   K 5.0 02/25/2021   CL 102 02/25/2021   CREATININE 0.75 02/25/2021   BUN 10 02/25/2021   CO2 24 02/25/2021   HGBA1C 5.8 (H) 02/25/2021   MICROALBUR 10 02/22/2020      Assessment & Plan:   Problem List Items Addressed This Visit   None .  No orders of the defined types were placed in this encounter.   No orders of the defined types were placed in this encounter.    Follow-up: No follow-ups on file.  An After Visit Summary was printed and given to the patient.  Rip Harbour, NP [place] [phone]

## 2021-08-26 ENCOUNTER — Ambulatory Visit: Payer: BC Managed Care – PPO | Admitting: Nurse Practitioner

## 2021-08-26 ENCOUNTER — Encounter: Payer: Self-pay | Admitting: Nurse Practitioner

## 2021-08-26 VITALS — BP 122/68 | HR 85 | Temp 97.3°F | Resp 18 | Ht 68.0 in | Wt 239.2 lb

## 2021-08-26 DIAGNOSIS — I152 Hypertension secondary to endocrine disorders: Secondary | ICD-10-CM

## 2021-08-26 DIAGNOSIS — Z6836 Body mass index (BMI) 36.0-36.9, adult: Secondary | ICD-10-CM

## 2021-08-26 DIAGNOSIS — E66812 Obesity, class 2: Secondary | ICD-10-CM

## 2021-08-26 DIAGNOSIS — E1169 Type 2 diabetes mellitus with other specified complication: Secondary | ICD-10-CM

## 2021-08-26 DIAGNOSIS — F17219 Nicotine dependence, cigarettes, with unspecified nicotine-induced disorders: Secondary | ICD-10-CM | POA: Diagnosis not present

## 2021-08-26 DIAGNOSIS — J432 Centrilobular emphysema: Secondary | ICD-10-CM

## 2021-08-26 DIAGNOSIS — E785 Hyperlipidemia, unspecified: Secondary | ICD-10-CM

## 2021-08-26 DIAGNOSIS — I700 Atherosclerosis of aorta: Secondary | ICD-10-CM

## 2021-08-26 DIAGNOSIS — E1159 Type 2 diabetes mellitus with other circulatory complications: Secondary | ICD-10-CM | POA: Diagnosis not present

## 2021-08-26 DIAGNOSIS — G4733 Obstructive sleep apnea (adult) (pediatric): Secondary | ICD-10-CM

## 2021-08-26 NOTE — Patient Instructions (Addendum)
Continue medications We will call you with lab results Obtain flu vaccine at work as discussed Follow-up in 30-months, fasting with Dr Sedalia Muta or sooner if needed   Diabetes Mellitus and Foot Care Foot care is an important part of your health, especially when you have diabetes. Diabetes may cause you to have problems because of poor blood flow (circulation) to your feet and legs, which can cause your skin to: Become thinner and drier. Break more easily. Heal more slowly. Peel and crack. You may also have nerve damage (neuropathy) in your legs and feet, causing decreased feeling in them. This means that you may not notice minor injuries to your feet that could lead to more serious problems. Noticing and addressing any potential problems early is the best way to prevent future foot problems. How to care for your feet Foot hygiene  Wash your feet daily with warm water and mild soap. Do not use hot water. Then, pat your feet and the areas between your toes until they are completely dry. Do not soak your feet as this can dry your skin. Trim your toenails straight across. Do not dig under them or around the cuticle. File the edges of your nails with an emery board or nail file. Apply a moisturizing lotion or petroleum jelly to the skin on your feet and to dry, brittle toenails. Use lotion that does not contain alcohol and is unscented. Do not apply lotion between your toes. Shoes and socks Wear clean socks or stockings every day. Make sure they are not too tight. Do not wear knee-high stockings since they may decrease blood flow to your legs. Wear shoes that fit properly and have enough cushioning. Always look in your shoes before you put them on to be sure there are no objects inside. To break in new shoes, wear them for just a few hours a day. This prevents injuries on your feet. Wounds, scrapes, corns, and calluses  Check your feet daily for blisters, cuts, bruises, sores, and redness. If you cannot  see the bottom of your feet, use a mirror or ask someone for help. Do not cut corns or calluses or try to remove them with medicine. If you find a minor scrape, cut, or break in the skin on your feet, keep it and the skin around it clean and dry. You may clean these areas with mild soap and water. Do not clean the area with peroxide, alcohol, or iodine. If you have a wound, scrape, corn, or callus on your foot, look at it several times a day to make sure it is healing and not infected. Check for: Redness, swelling, or pain. Fluid or blood. Warmth. Pus or a bad smell. General tips Do not cross your legs. This may decrease blood flow to your feet. Do not use heating pads or hot water bottles on your feet. They may burn your skin. If you have lost feeling in your feet or legs, you may not know this is happening until it is too late. Protect your feet from hot and cold by wearing shoes, such as at the beach or on hot pavement. Schedule a complete foot exam at least once a year (annually) or more often if you have foot problems. Report any cuts, sores, or bruises to your health care provider immediately. Where to find more information American Diabetes Association: www.diabetes.org Association of Diabetes Care & Education Specialists: www.diabeteseducator.org Contact a health care provider if: You have a medical condition that increases your risk of  infection and you have any cuts, sores, or bruises on your feet. You have an injury that is not healing. You have redness on your legs or feet. You feel burning or tingling in your legs or feet. You have pain or cramps in your legs and feet. Your legs or feet are numb. Your feet always feel cold. You have pain around any toenails. Get help right away if: You have a wound, scrape, corn, or callus on your foot and: You have pain, swelling, or redness that gets worse. You have fluid or blood coming from the wound, scrape, corn, or callus. Your wound,  scrape, corn, or callus feels warm to the touch. You have pus or a bad smell coming from the wound, scrape, corn, or callus. You have a fever. You have a red line going up your leg. Summary Check your feet every day for blisters, cuts, bruises, sores, and redness. Apply a moisturizing lotion or petroleum jelly to the skin on your feet and to dry, brittle toenails. Wear shoes that fit properly and have enough cushioning. If you have foot problems, report any cuts, sores, or bruises to your health care provider immediately. Schedule a complete foot exam at least once a year (annually) or more often if you have foot problems. This information is not intended to replace advice given to you by your health care provider. Make sure you discuss any questions you have with your health care provider. Document Revised: 07/13/2019 Document Reviewed: 07/13/2019 Elsevier Patient Education  2023 [employer].

## 2021-08-27 LAB — COMPREHENSIVE METABOLIC PANEL
ALT: 21 IU/L (ref 0–32)
AST: 17 IU/L (ref 0–40)
Albumin/Globulin Ratio: 1.9 (ref 1.2–2.2)
Albumin: 4.4 g/dL (ref 3.8–4.9)
Alkaline Phosphatase: 109 IU/L (ref 44–121)
BUN/Creatinine Ratio: 19 (ref 12–28)
BUN: 12 mg/dL (ref 8–27)
Bilirubin Total: 0.6 mg/dL (ref 0.0–1.2)
CO2: 18 mmol/L — ABNORMAL LOW (ref 20–29)
Calcium: 9.8 mg/dL (ref 8.7–10.3)
Chloride: 104 mmol/L (ref 96–106)
Creatinine, Ser: 0.63 mg/dL (ref 0.57–1.00)
Globulin, Total: 2.3 g/dL (ref 1.5–4.5)
Glucose: 110 mg/dL — ABNORMAL HIGH (ref 70–99)
Potassium: 4.4 mmol/L (ref 3.5–5.2)
Sodium: 139 mmol/L (ref 134–144)
Total Protein: 6.7 g/dL (ref 6.0–8.5)
eGFR: 101 mL/min/{1.73_m2} (ref >59)

## 2021-08-27 LAB — LIPID PANEL
Chol/HDL Ratio: 2.2 ratio (ref 0.0–4.4)
Cholesterol, Total: 147 mg/dL (ref 100–199)
HDL: 67 mg/dL (ref >39)
LDL Chol Calc (NIH): 64 mg/dL (ref 0–99)
Triglycerides: 86 mg/dL (ref 0–149)
VLDL Cholesterol Cal: 16 mg/dL (ref 5–40)

## 2021-08-27 LAB — CBC WITH DIFFERENTIAL/PLATELET
Basophils Absolute: 0.1 10*3/uL (ref 0.0–0.2)
Basos: 0 %
EOS (ABSOLUTE): 0.1 10*3/uL (ref 0.0–0.4)
Eos: 1 %
Hematocrit: 40.6 % (ref 34.0–46.6)
Hemoglobin: 13.7 g/dL (ref 11.1–15.9)
Immature Grans (Abs): 0.1 10*3/uL (ref 0.0–0.1)
Immature Granulocytes: 1 %
Lymphocytes Absolute: 2.9 10*3/uL (ref 0.7–3.1)
Lymphs: 26 %
MCH: 33 pg (ref 26.6–33.0)
MCHC: 33.7 g/dL (ref 31.5–35.7)
MCV: 98 fL — ABNORMAL HIGH (ref 79–97)
Monocytes Absolute: 0.7 10*3/uL (ref 0.1–0.9)
Monocytes: 6 %
Neutrophils Absolute: 7.4 10*3/uL — ABNORMAL HIGH (ref 1.4–7.0)
Neutrophils: 66 %
Platelets: 290 10*3/uL (ref 150–450)
RBC: 4.15 x10E6/uL (ref 3.77–5.28)
RDW: 13.4 % (ref 11.7–15.4)
WBC: 11.2 10*3/uL — ABNORMAL HIGH (ref 3.4–10.8)

## 2021-08-27 LAB — HEMOGLOBIN A1C
Est. average glucose Bld gHb Est-mCnc: 126 mg/dL
Hgb A1c MFr Bld: 6 % — ABNORMAL HIGH (ref 4.8–5.6)

## 2021-08-27 LAB — CARDIOVASCULAR RISK ASSESSMENT

## 2021-08-27 LAB — T4, FREE: Free T4: 1.46 ng/dL (ref 0.82–1.77)

## 2021-08-27 LAB — TSH: TSH: 1.04 u[IU]/mL (ref 0.450–4.500)

## 2021-12-18 ENCOUNTER — Other Ambulatory Visit: Payer: Self-pay | Admitting: Family Medicine

## 2021-12-18 DIAGNOSIS — B370 Candidal stomatitis: Secondary | ICD-10-CM

## 2022-01-25 ENCOUNTER — Other Ambulatory Visit: Payer: Self-pay | Admitting: Family Medicine

## 2022-01-25 DIAGNOSIS — I10 Essential (primary) hypertension: Secondary | ICD-10-CM

## 2022-01-26 ENCOUNTER — Encounter: Payer: Self-pay | Admitting: Nurse Practitioner

## 2022-01-26 ENCOUNTER — Ambulatory Visit: Payer: BC Managed Care – PPO | Admitting: Nurse Practitioner

## 2022-01-26 VITALS — BP 120/70 | HR 113 | Temp 97.5°F | Resp 18 | Ht 69.0 in | Wt 235.0 lb

## 2022-01-26 DIAGNOSIS — J441 Chronic obstructive pulmonary disease with (acute) exacerbation: Secondary | ICD-10-CM | POA: Diagnosis not present

## 2022-01-26 DIAGNOSIS — R051 Acute cough: Secondary | ICD-10-CM | POA: Insufficient documentation

## 2022-01-26 DIAGNOSIS — J0100 Acute maxillary sinusitis, unspecified: Secondary | ICD-10-CM | POA: Diagnosis not present

## 2022-01-26 DIAGNOSIS — J440 Chronic obstructive pulmonary disease with acute lower respiratory infection: Secondary | ICD-10-CM

## 2022-01-26 DIAGNOSIS — J209 Acute bronchitis, unspecified: Secondary | ICD-10-CM | POA: Insufficient documentation

## 2022-01-26 LAB — POCT INFLUENZA A/B
Influenza A, POC: NEGATIVE
Influenza B, POC: NEGATIVE

## 2022-01-26 LAB — POC COVID19 BINAXNOW: SARS Coronavirus 2 Ag: NEGATIVE

## 2022-01-26 MED ORDER — FLUTICASONE PROPIONATE 50 MCG/ACT NA SUSP: 2.0000 | Freq: Every day | NASAL | 6 refills | Status: DC

## 2022-01-26 MED ORDER — TRIAMCINOLONE ACETONIDE 40 MG/ML IJ SUSP
80.0000 mg | Freq: Once | INTRAMUSCULAR | Status: AC
  Administered 2022-01-26: 80 mg via INTRAMUSCULAR

## 2022-01-26 MED ORDER — PREDNISONE 50 MG PO TABS: 50.0000 mg | ORAL_TABLET | Freq: Every day | ORAL | 0 refills | Status: DC

## 2022-01-26 MED ORDER — PROMETHAZINE-DM 6.25-15 MG/5ML PO SYRP: 5.0000 mL | ORAL_SOLUTION | Freq: Four times a day (QID) | ORAL | 0 refills | Status: DC | PRN

## 2022-01-26 MED ORDER — AZITHROMYCIN 250 MG PO TABS: ORAL_TABLET | ORAL | 0 refills | Status: AC

## 2022-01-26 NOTE — Patient Instructions (Signed)
Take antibiotics as prescribed Use Flonase nasal spray daily Take Promethazine-DM up to 4 times daily for cough/sinus congestion Drink plenty of fluids Follow-up as needed     Sinus Infection, Adult A sinus infection, also called sinusitis, is inflammation of your sinuses. Sinuses are hollow spaces in the bones around your face. Your sinuses are located: Around your eyes. In the middle of your forehead. Behind your nose. In your cheekbones. Mucus normally drains out of your sinuses. When your nasal tissues become inflamed or swollen, mucus can become trapped or blocked. This allows bacteria, viruses, and fungi to grow, which leads to infection. Most infections of the sinuses are caused by a virus. A sinus infection can develop quickly. It can last for up to 4 weeks (acute) or for more than 12 weeks (chronic). A sinus infection often develops after a cold. What are the causes? This condition is caused by anything that creates swelling in the sinuses or stops mucus from draining. This includes: Allergies. Asthma. Infection from bacteria or viruses. Deformities or blockages in your nose or sinuses. Abnormal growths in the nose (nasal polyps). Pollutants, such as chemicals or irritants in the air. Infection from fungi. This is rare. What increases the risk? You are more likely to develop this condition if you: Have a weak body defense system (immune system). Do a lot of swimming or diving. Overuse nasal sprays. Smoke. What are the signs or symptoms? The main symptoms of this condition are pain and a feeling of pressure around the affected sinuses. Other symptoms include: Stuffy nose or congestion that makes it difficult to breathe through your nose. Thick yellow or greenish drainage from your nose. Tenderness, swelling, and warmth over the affected sinuses. A cough that may get worse at night. Decreased sense of smell and taste. Extra mucus that collects in the throat or the back of  the nose (postnasal drip) causing a sore throat or bad breath. Tiredness (fatigue). Fever. How is this diagnosed? This condition is diagnosed based on: Your symptoms. Your medical history. A physical exam. Tests to find out if your condition is acute or chronic. This may include: Checking your nose for nasal polyps. Viewing your sinuses using a device that has a light (endoscope). Testing for allergies or bacteria. Imaging tests, such as an MRI or CT scan. In rare cases, a bone biopsy may be done to rule out more serious types of fungal sinus disease. How is this treated? Treatment for a sinus infection depends on the cause and whether your condition is chronic or acute. If caused by a virus, your symptoms should go away on their own within 10 days. You may be given medicines to relieve symptoms. They include: Medicines that shrink swollen nasal passages (decongestants). A spray that eases inflammation of the nostrils (topical intranasal corticosteroids). Rinses that help get rid of thick mucus in your nose (nasal saline washes). Medicines that treat allergies (antihistamines). Over-the-counter pain relievers. If caused by bacteria, your health care provider may recommend waiting to see if your symptoms improve. Most bacterial infections will get better without antibiotic medicine. You may be given antibiotics if you have: A severe infection. A weak immune system. If caused by narrow nasal passages or nasal polyps, surgery may be needed. Follow these instructions at home: Medicines Take, use, or apply over-the-counter and prescription medicines only as told by your health care provider. These may include nasal sprays. If you were prescribed an antibiotic medicine, take it as told by your health care provider.   Do not stop taking the antibiotic even if you start to feel better. Hydrate and humidify  Drink enough fluid to keep your urine pale yellow. Staying hydrated will help to thin  your mucus. Use a cool mist humidifier to keep the humidity level in your home above 50%. Inhale steam for 10-15 minutes, 3-4 times a day, or as told by your health care provider. You can do this in the bathroom while a hot shower is running. Limit your exposure to cool or dry air. Rest Rest as much as possible. Sleep with your head raised (elevated). Make sure you get enough sleep each night. General instructions  Apply a warm, moist washcloth to your face 3-4 times a day or as told by your health care provider. This will help with discomfort. Use nasal saline washes as often as told by your health care provider. Wash your hands often with soap and water to reduce your exposure to germs. If soap and water are not available, use hand sanitizer. Do not smoke. Avoid being around people who are smoking (secondhand smoke). Keep all follow-up visits. This is important. Contact a health care provider if: You have a fever. Your symptoms get worse. Your symptoms do not improve within 10 days. Get help right away if: You have a severe headache. You have persistent vomiting. You have severe pain or swelling around your face or eyes. You have vision problems. You develop confusion. Your neck is stiff. You have trouble breathing. These symptoms may be an emergency. Get help right away. Call 911. Do not wait to see if the symptoms will go away. Do not drive yourself to the hospital. Summary A sinus infection is soreness and inflammation of your sinuses. Sinuses are hollow spaces in the bones around your face. This condition is caused by nasal tissues that become inflamed or swollen. The swelling traps or blocks the flow of mucus. This allows bacteria, viruses, and fungi to grow, which leads to infection. If you were prescribed an antibiotic medicine, take it as told by your health care provider. Do not stop taking the antibiotic even if you start to feel better. Keep all follow-up visits. This  is important. This information is not intended to replace advice given to you by your health care provider. Make sure you discuss any questions you have with your health care provider. Document Revised: 11/26/2020 Document Reviewed: 11/26/2020 Elsevier Patient Education  2023 [employer]  

## 2022-01-26 NOTE — Assessment & Plan Note (Addendum)
Finished the 5 days course of prednisone Use albuterol as needed and Breztri 2 puff 2 times daily. Smoking cessation discussed

## 2022-01-26 NOTE — Assessment & Plan Note (Signed)
Take antibiotics as prescribed Use Flonase nasal spray daily Take Promethazine-DM up to 4 times daily for cough/sinus congestion Drink plenty of fluids Follow-up as needed  

## 2022-01-26 NOTE — Assessment & Plan Note (Signed)
Use albuterol as needed and Breztri 2 puff 2 times daily. Smoking cessation discussed

## 2022-01-26 NOTE — Progress Notes (Signed)
Acute Office Visit  Subjective:    Patient ID: Kathleen Wells, female    DOB: 22-Dec-1960, 62 y.o.   MRN: [id number]  Chief Complaints: cough and congestion  History of Present ilIness: Patient is in today for URI symptoms  Upper respiratory symptoms Patient is here with a  Complaints headache, fatigue, cough, chest congestion and wheezing. Complaints of sinus pain and pressure. This is going on since 7-10 days but now it is  rapidly worsening.She is drinking plenty of fluids.  Past history is significant for COPD . Patient is smoker  (yes ppd x 40 yrs) . Denies SHORTNESS OF BREATH , chest pain and palpitation.  Past Medical History:  Diagnosis Date   Essential (primary) hypertension    Mixed hyperlipidemia    Obstructive sleep apnea (adult) (pediatric)    Type 2 diabetes mellitus with other specified complication (HCC)     No past surgical history on file.  Family History  Problem Relation Age of Onset   Lung cancer Mother    Congestive Heart Failure Father    Lupus Sister    Stomach cancer Maternal Grandmother    Hepatitis C Brother    Breast cancer Neg Hx     Social History   Socioeconomic History   Marital status: Soil scientist    Spouse name: Not on file   Number of children: 0   Years of education: Not on file   Highest education level: Not on file  Occupational History   Occupation: OFFICE  Tobacco Use   Smoking status: Every Day    Packs/day: 0.50    Years: 42.00    Total pack years: 21.00    Types: Cigarettes   Smokeless tobacco: Never  Vaping Use   Vaping Use: Never used  Substance and Sexual Activity   Alcohol use: Yes    Alcohol/week: 8.0 standard drinks of alcohol    Types: 4 Cans of beer, 4 Standard drinks or equivalent per week   Drug use: Never   Sexual activity: Not Currently  Other Topics Concern   Not on file  Social History Narrative   Not on file   Social Determinants of Health   Financial Resource Strain: Low Risk   (03/02/2021)   Overall Financial Resource Strain (CARDIA)    Difficulty of Paying Living Expenses: Not hard at all  Food Insecurity: No Food Insecurity (03/02/2021)   Hunger Vital Sign    Worried About Running Out of Food in the Last Year: Never true    Ran Out of Food in the Last Year: Never true  Transportation Needs: Unknown (03/02/2021)   PRAPARE - Hydrologist (Medical): No    Lack of Transportation (Non-Medical): Not on file  Physical Activity: Insufficiently Active (03/02/2021)   Exercise Vital Sign    Days of Exercise per Week: 2 days    Minutes of Exercise per Session: 20 min  Stress: No Stress Concern Present (03/02/2021)   [place]    Feeling of Stress : Not at all  Social Connections: Unknown (03/02/2021)   Social Connection and Isolation Panel [NHANES]    Frequency of Communication with Friends and Family: More than three times a week    Frequency of Social Gatherings with Friends and Family: Three times a week    Attends Religious Services: Not on file    Active Member of Clubs or Organizations: Not on file    Attends Club or  Organization Meetings: Not on file    Marital Status: Living with partner  Intimate Partner Violence: Not At Risk (11/22/2019)   Humiliation, Afraid, Rape, and Kick questionnaire    Fear of Current or Ex-Partner: No    Emotionally Abused: No    Physically Abused: No    Sexually Abused: No    Outpatient Medications Prior to Visit  Medication Sig Dispense Refill   ACCU-CHEK GUIDE test strip USE TO TEST UP TO 4 TIMES DAILY AS DIRECTED 100 strip 2   albuterol (VENTOLIN HFA) 108 (90 Base) MCG/ACT inhaler Inhale 2 puffs into the lungs every 6 (six) hours as needed for wheezing or shortness of breath. 8 g 2   aspirin 81 MG chewable tablet Chew 81 mg by mouth daily.     atorvastatin (LIPITOR) 40 MG tablet TAKE 1 TABLET BY MOUTH EVERY DAY 90 tablet 1   BREZTRI  AEROSPHERE 160-9-4.8 MCG/ACT AERO INHALE 2 PUFFS INTO THE LUNGS IN THE MORNING AND AT BEDTIME. 10.7 g 11   nystatin (MYCOSTATIN) 100000 UNIT/ML suspension USE AS DIRECTED 5 MLS (500,000 UNITS TOTAL) IN THE MOUTH OR THROAT 4 (FOUR) TIMES DAILY. 60 mL 0   olmesartan (BENICAR) 20 MG tablet TAKE 1 TABLET BY MOUTH EVERY DAY 90 tablet 0   No facility-administered medications prior to visit.    Allergies  Allergen Reactions   Lisinopril Diarrhea   Septra [Sulfamethoxazole-Trimethoprim] Hives   Sulfa Antibiotics Hives    Review of Systems  HENT:  Positive for congestion, postnasal drip, sinus pressure and sinus pain. Negative for ear pain, facial swelling, sneezing, sore throat and trouble swallowing.   Respiratory:  Positive for cough, chest tightness and wheezing.        Objective:    Physical Exam Vitals reviewed.  Constitutional:      Appearance: Normal appearance. She is normal weight.  HENT:     Nose:     Right Turbinates: Enlarged.     Left Turbinates: Enlarged.     Right Sinus: Maxillary sinus tenderness and frontal sinus tenderness present.     Left Sinus: Maxillary sinus tenderness and frontal sinus tenderness present.  Neck:     Vascular: No carotid bruit.  Cardiovascular:     Rate and Rhythm: Normal rate and regular rhythm.     Heart sounds: Normal heart sounds.  Pulmonary:     Effort: Pulmonary effort is normal.     Breath sounds: Examination of the right-upper field reveals wheezing. Examination of the left-upper field reveals wheezing. Examination of the right-middle field reveals wheezing. Examination of the left-middle field reveals wheezing. Wheezing present.  Abdominal:     General: Abdomen is flat. Bowel sounds are normal.     Palpations: Abdomen is soft.     Tenderness: There is no abdominal tenderness.  Musculoskeletal:     Cervical back: Normal range of motion.  Neurological:     Mental Status: She is alert and oriented to person, place, and time.   Psychiatric:        Mood and Affect: Mood normal.        Behavior: Behavior normal.     BP 120/70   Pulse (!) 113   Temp (!) 97.5 F (36.4 C)   Resp 18   Ht 5\' 9"  (1.753 m)   Wt 235 lb (106.6 kg)   SpO2 94%   BMI 34.70 kg/m  Wt Readings from Last 3 Encounters:  01/26/22 235 lb (106.6 kg)  08/26/21 239 lb 3.2 oz (108.5  kg)  06/17/21 240 lb (108.9 kg)    Health Maintenance Due  Topic Date Due   OPHTHALMOLOGY EXAM  05/30/2021   FOOT EXAM  08/21/2021   COVID-19 Vaccine (4 - 2023-24 season) 09/05/2021   Diabetic kidney evaluation - Urine ACR  02/25/2022    There are no preventive care reminders to display for this patient.   Lab Results  Component Value Date   TSH 1.040 08/26/2021   Lab Results  Component Value Date   WBC 11.2 (H) 08/26/2021   HGB 13.7 08/26/2021   HCT 40.6 08/26/2021   MCV 98 (H) 08/26/2021   PLT 290 08/26/2021   Lab Results  Component Value Date   NA 139 08/26/2021   K 4.4 08/26/2021   CO2 18 (L) 08/26/2021   GLUCOSE 110 (H) 08/26/2021   BUN 12 08/26/2021   CREATININE 0.63 08/26/2021   BILITOT 0.6 08/26/2021   ALKPHOS 109 08/26/2021   AST 17 08/26/2021   ALT 21 08/26/2021   PROT 6.7 08/26/2021   ALBUMIN 4.4 08/26/2021   CALCIUM 9.8 08/26/2021   EGFR 101 08/26/2021   Lab Results  Component Value Date   CHOL 147 08/26/2021   Lab Results  Component Value Date   HDL 67 08/26/2021   Lab Results  Component Value Date   LDLCALC 64 08/26/2021   Lab Results  Component Value Date   TRIG 86 08/26/2021   Lab Results  Component Value Date   CHOLHDL 2.2 08/26/2021   Lab Results  Component Value Date   HGBA1C 6.0 (H) 08/26/2021       Assessment & Plan:  Acute non-recurrent maxillary sinusitis Assessment & Plan: Take antibiotics as prescribed Use Flonase nasal spray daily Take Promethazine-DM up to 4 times daily for cough/sinus congestion Drink plenty of fluids Follow-up as needed   Orders: -     Azithromycin; Take 2  tablets on day 1, then 1 tablet daily on days 2 through 5  Dispense: 6 tablet; Refill: 0 -     Triamcinolone Acetonide -     Promethazine-DM; Take 5 mLs by mouth 4 (four) times daily as needed.  Dispense: 118 mL; Refill: 0 -     Fluticasone Propionate; Place 2 sprays into both nostrils daily.  Dispense: 16 g; Refill: 6 -     predniSONE; Take 1 tablet (50 mg total) by mouth daily with breakfast.  Dispense: 5 tablet; Refill: 0  Acute bronchitis with COPD (HCC) Assessment & Plan: Use albuterol as needed and Breztri 2 puff 2 times daily. Smoking cessation discussed  Orders: -     Azithromycin; Take 2 tablets on day 1, then 1 tablet daily on days 2 through 5  Dispense: 6 tablet; Refill: 0 -     Triamcinolone Acetonide -     Fluticasone Propionate; Place 2 sprays into both nostrils daily.  Dispense: 16 g; Refill: 6 -     predniSONE; Take 1 tablet (50 mg total) by mouth daily with breakfast.  Dispense: 5 tablet; Refill: 0  Acute cough -     POC COVID-19 BinaxNow -     POCT Influenza A/B -     Fluticasone Propionate; Place 2 sprays into both nostrils daily.  Dispense: 16 g; Refill: 6  COPD exacerbation (HCC) Assessment & Plan: Finished the 5 days course of prednisone Use albuterol as needed and Breztri 2 puff 2 times daily. Smoking cessation discussed    Follow-up:  as needed I, Lyrika Souders have reviewed all documentation for  this visit. The documentation on 01/26/22   for the exam, diagnosis, procedures, and orders are all accurate and complete.     An After Visit Summary was printed and given to the patient.  Lurline Del, DNP, FNP [facility] [phone]

## 2022-01-28 ENCOUNTER — Encounter: Payer: Self-pay | Admitting: Nurse Practitioner

## 2022-03-03 ENCOUNTER — Encounter: Payer: Self-pay | Admitting: Family Medicine

## 2022-03-03 ENCOUNTER — Ambulatory Visit: Payer: BC Managed Care – PPO | Admitting: Family Medicine

## 2022-03-03 VITALS — BP 136/76 | HR 81 | Temp 97.5°F | Ht 69.0 in | Wt 234.0 lb

## 2022-03-03 DIAGNOSIS — E782 Mixed hyperlipidemia: Secondary | ICD-10-CM

## 2022-03-03 DIAGNOSIS — F17219 Nicotine dependence, cigarettes, with unspecified nicotine-induced disorders: Secondary | ICD-10-CM

## 2022-03-03 DIAGNOSIS — Z6834 Body mass index (BMI) 34.0-34.9, adult: Secondary | ICD-10-CM | POA: Insufficient documentation

## 2022-03-03 DIAGNOSIS — Z122 Encounter for screening for malignant neoplasm of respiratory organs: Secondary | ICD-10-CM | POA: Diagnosis not present

## 2022-03-03 DIAGNOSIS — Z6833 Body mass index (BMI) 33.0-33.9, adult: Secondary | ICD-10-CM | POA: Insufficient documentation

## 2022-03-03 DIAGNOSIS — Z1231 Encounter for screening mammogram for malignant neoplasm of breast: Secondary | ICD-10-CM | POA: Insufficient documentation

## 2022-03-03 DIAGNOSIS — Z716 Tobacco abuse counseling: Secondary | ICD-10-CM | POA: Insufficient documentation

## 2022-03-03 DIAGNOSIS — E1159 Type 2 diabetes mellitus with other circulatory complications: Secondary | ICD-10-CM

## 2022-03-03 DIAGNOSIS — I152 Hypertension secondary to endocrine disorders: Secondary | ICD-10-CM

## 2022-03-03 DIAGNOSIS — J410 Simple chronic bronchitis: Secondary | ICD-10-CM

## 2022-03-03 NOTE — Assessment & Plan Note (Addendum)
Well controlled.  No changes to medicines. Olmesartan  20 mg daily Continue to work on eating a healthy diet and exercise.  Labs drawn today.

## 2022-03-03 NOTE — Patient Instructions (Signed)
Use albuterol inhaler 2 puffs twice a day for the next 48 hours.

## 2022-03-03 NOTE — Assessment & Plan Note (Signed)
Control: good ?Recommend check feet daily. ?Recommend annual eye exams. ?Medicines: none ?Continue to work on eating a healthy diet and exercise.  ?Labs drawn today.   ? ?

## 2022-03-03 NOTE — Assessment & Plan Note (Signed)
Continue breztri 2 puffs twice daily.  Recommend use albuterol hfa 2 puffs four times a day x 2 days since wheezing a little, 2 puffs four times a day as needed  Recommend quit smoking.

## 2022-03-03 NOTE — Assessment & Plan Note (Signed)
Recommend cessation. Recommend slow wean.

## 2022-03-03 NOTE — Assessment & Plan Note (Addendum)
Well controlled.  No changes to medicines. Lipitor 40 mg daily Continue to work on eating a healthy diet and exercise.  Labs drawn today.

## 2022-03-03 NOTE — Progress Notes (Signed)
Subjective:  Patient ID: Kathleen Wells, female    DOB: 01/05/1961  Age: 62 y.o. MRN: [id number]  Chief Complaint  Patient presents with   Hyperlipidemia   Hypertension   Diabetes    HPI   DMII: No medications-Diet controlled, does not check FBS, checks feet daily, UTD with eye exam  Hyperlipidemia: Lipitor 40 mg daily  HTN: Olmesartan  20 mg daily  COPD: Breztri and Albuterol. Continues to smoke. 1/2 ppd.      03/03/2022    8:03 AM 08/26/2021    8:04 AM 03/02/2021    6:32 PM 08/21/2020    8:17 AM 08/22/2019    8:29 AM  Depression screen PHQ 2/9  Decreased Interest 0 0 0 0 0  Down, Depressed, Hopeless 0 0 0 0 0  PHQ - 2 Score 0 0 0 0 0         02/22/2020    8:12 AM 08/21/2020    8:17 AM 08/26/2021    8:04 AM 03/03/2022    8:03 AM  Fall Risk  Falls in the past year? 0 0 0 0  Was there an injury with Fall? 0 0 0 0  Fall Risk Category Calculator 0 0 0 0  Fall Risk Category (Retired) Low Low Low   (RETIRED) Patient Fall Risk Level Low fall risk Low fall risk Low fall risk   Patient at Risk for Falls Due to  No Fall Risks Impaired vision No Fall Risks  Fall risk Follow up  Falls evaluation completed Falls evaluation completed Falls evaluation completed      Review of Systems  Constitutional:  Negative for chills, fatigue and fever.  HENT:  Negative for congestion, ear pain, rhinorrhea and sore throat.   Respiratory:  Negative for cough and shortness of breath.   Cardiovascular:  Negative for chest pain.  Gastrointestinal:  Negative for abdominal pain, constipation, diarrhea, nausea and vomiting.  Genitourinary:  Negative for dysuria and urgency.  Musculoskeletal:  Negative for back pain and myalgias.  Neurological:  Negative for dizziness, weakness, light-headedness and headaches.  Psychiatric/Behavioral:  Negative for dysphoric mood. The patient is not nervous/anxious.     Current Outpatient Medications on File Prior to Visit  Medication Sig Dispense Refill    ACCU-CHEK GUIDE test strip USE TO TEST UP TO 4 TIMES DAILY AS DIRECTED 100 strip 2   albuterol (VENTOLIN HFA) 108 (90 Base) MCG/ACT inhaler Inhale 2 puffs into the lungs every 6 (six) hours as needed for wheezing or shortness of breath. 8 g 2   aspirin 81 MG chewable tablet Chew 81 mg by mouth daily.     atorvastatin (LIPITOR) 40 MG tablet TAKE 1 TABLET BY MOUTH EVERY DAY 90 tablet 1   BREZTRI AEROSPHERE 160-9-4.8 MCG/ACT AERO INHALE 2 PUFFS INTO THE LUNGS IN THE MORNING AND AT BEDTIME. 10.7 g 11   fluticasone (FLONASE) 50 MCG/ACT nasal spray Place 2 sprays into both nostrils daily. 16 g 6   olmesartan (BENICAR) 20 MG tablet TAKE 1 TABLET BY MOUTH EVERY DAY 90 tablet 0   No current facility-administered medications on file prior to visit.   Past Medical History:  Diagnosis Date   Essential (primary) hypertension    Mixed hyperlipidemia    Obstructive sleep apnea (adult) (pediatric)    Type 2 diabetes mellitus with other specified complication ([place])    History reviewed. No pertinent surgical history.  Family History  Problem Relation Age of Onset   Lung cancer Mother    Congestive  Heart Failure Father    Lupus Sister    Stomach cancer Maternal Grandmother    Hepatitis C Brother    Breast cancer Neg Hx    Social History   Socioeconomic History   Marital status: Soil scientist    Spouse name: Not on file   Number of children: 0   Years of education: Not on file   Highest education level: Not on file  Occupational History   Occupation: OFFICE  Tobacco Use   Smoking status: Every Day    Packs/day: 0.50    Years: 42.00    Total pack years: 21.00    Types: Cigarettes   Smokeless tobacco: Never  Vaping Use   Vaping Use: Never used  Substance and Sexual Activity   Alcohol use: Yes    Alcohol/week: 8.0 standard drinks of alcohol    Types: 4 Cans of beer, 4 Standard drinks or equivalent per week   Drug use: Never   Sexual activity: Not Currently  Other Topics Concern   Not  on file  Social History Narrative   Not on file   Social Determinants of Health   Financial Resource Strain: Low Risk  (03/02/2021)   Overall Financial Resource Strain (CARDIA)    Difficulty of Paying Living Expenses: Not hard at all  Food Insecurity: No Food Insecurity (03/02/2021)   Hunger Vital Sign    Worried About Running Out of Food in the Last Year: Never true    Ran Out of Food in the Last Year: Never true  Transportation Needs: No Transportation Needs (03/03/2022)   PRAPARE - Hydrologist (Medical): No    Lack of Transportation (Non-Medical): No  Physical Activity: Insufficiently Active (03/02/2021)   Exercise Vital Sign    Days of Exercise per Week: 2 days    Minutes of Exercise per Session: 20 min  Stress: No Stress Concern Present (03/02/2021)   [place]    Feeling of Stress : Not at all  Social Connections: Moderately Isolated (03/03/2022)   Social Connection and Isolation Panel [NHANES]    Frequency of Communication with Friends and Family: Three times a week    Frequency of Social Gatherings with Friends and Family: Three times a week    Attends Religious Services: Never    Active Member of Clubs or Organizations: No    Attends Archivist Meetings: Never    Marital Status: Living with partner    Objective:  BP 136/76   Pulse 81   Temp (!) 97.5 F (36.4 C)   Ht '5\' 9"'$  (1.753 m)   Wt 234 lb (106.1 kg)   SpO2 96%   BMI 34.56 kg/m      03/03/2022    8:00 AM 01/26/2022    2:01 PM 08/26/2021    7:59 AM  BP/Weight  Systolic BP [id number]  Diastolic BP 76 70 68  Wt. (Lbs) 234 235 239.2  BMI 34.56 kg/m2 34.7 kg/m2 36.37 kg/m2    Physical Exam Vitals reviewed.  Constitutional:      Appearance: Normal appearance. She is normal weight.  Neck:     Vascular: No carotid bruit.  Cardiovascular:     Rate and Rhythm: Normal rate and regular rhythm.     Heart  sounds: Normal heart sounds.  Pulmonary:     Effort: Pulmonary effort is normal. No respiratory distress.     Breath sounds: Wheezing (RUL) present.  Abdominal:  General: Abdomen is flat. Bowel sounds are normal.     Palpations: Abdomen is soft.     Tenderness: There is no abdominal tenderness.  Neurological:     Mental Status: She is alert and oriented to person, place, and time.  Psychiatric:        Mood and Affect: Mood normal.        Behavior: Behavior normal.     Diabetic Foot Exam - Simple   Simple Foot Form Diabetic Foot exam was performed with the following findings: Yes 03/03/2022  8:18 AM  Visual Inspection No deformities, no ulcerations, no other skin breakdown bilaterally: Yes Sensation Testing Intact to touch and monofilament testing bilaterally: Yes Pulse Check Posterior Tibialis and Dorsalis pulse intact bilaterally: Yes Comments Red spot on top of right foot over second toe Small spider veins over BL lower legs      Lab Results  Component Value Date   WBC 11.2 (H) 08/26/2021   HGB 13.7 08/26/2021   HCT 40.6 08/26/2021   PLT 290 08/26/2021   GLUCOSE 110 (H) 08/26/2021   CHOL 147 08/26/2021   TRIG 86 08/26/2021   HDL 67 08/26/2021   LDLCALC 64 08/26/2021   ALT 21 08/26/2021   AST 17 08/26/2021   NA 139 08/26/2021   K 4.4 08/26/2021   CL 104 08/26/2021   CREATININE 0.63 08/26/2021   BUN 12 08/26/2021   CO2 18 (L) 08/26/2021   TSH 1.040 08/26/2021   HGBA1C 6.0 (H) 08/26/2021   MICROALBUR 10 02/22/2020      Assessment & Plan:    Hypertension associated with diabetes ([place]) Assessment & Plan: Well controlled.  No changes to medicines. Olmesartan  20 mg daily Continue to work on eating a healthy diet and exercise.  Labs drawn today.   Orders: -     CBC with Differential/Platelet -     Comprehensive metabolic panel -     Hemoglobin A1c -     Lipid panel -     Microalbumin / creatinine urine ratio  Mixed hyperlipidemia Assessment &  Plan: Well controlled.  No changes to medicines. Lipitor 40 mg daily Continue to work on eating a healthy diet and exercise.  Labs drawn today.    Encounter for screening mammogram for malignant neoplasm of breast -     Digital Screening Mammogram, Left and Right; Future  Screening for lung cancer Assessment & Plan: Ordered CT LUNG SCREENING.   Orders: -     CT CHEST LUNG CANCER SCREENING LOW DOSE WO CONTRAST  Cigarette nicotine dependence with nicotine-induced disorder Assessment & Plan: Recommend cessation. Recommend slow wean.   Body mass index (BMI) of 34.0-34.9 in adult  Encounter for smoking cessation counseling  Simple chronic bronchitis (HCC) Assessment & Plan: Continue breztri 2 puffs twice daily.  Recommend use albuterol hfa 2 puffs four times a day x 2 days since wheezing a little, 2 puffs four times a day as needed  Recommend quit smoking.      No orders of the defined types were placed in this encounter.   Orders Placed This Encounter  Procedures   MM DIGITAL SCREENING BILATERAL   CT CHEST LUNG CANCER SCREENING LOW DOSE WO CONTRAST   CBC with Differential/Platelet   Comprehensive metabolic panel   Hemoglobin A1c   Lipid panel   Microalbumin / creatinine urine ratio     Follow-up: Return in about 3 months (around 06/01/2022) for chronic, fasting.   I,Katherina A Bramblett,acting as a scribe for  Rochel Brome, MD.,have documented all relevant documentation on the behalf of Rochel Brome, MD,as directed by  Rochel Brome, MD while in the presence of Rochel Brome, MD.   An After Visit Summary was printed and given to the patient.  I attest that I have reviewed this visit and agree with the plan scribed by my staff.   Rochel Brome, MD [facility] [phone]

## 2022-03-03 NOTE — Assessment & Plan Note (Signed)
Ordered CT LUNG SCREENING.

## 2022-03-04 LAB — COMPREHENSIVE METABOLIC PANEL
ALT: 27 IU/L (ref 0–32)
AST: 20 IU/L (ref 0–40)
Albumin/Globulin Ratio: 2.2 (ref 1.2–2.2)
Albumin: 4.8 g/dL (ref 3.9–4.9)
Alkaline Phosphatase: 112 IU/L (ref 44–121)
BUN/Creatinine Ratio: 19 (ref 12–28)
BUN: 13 mg/dL (ref 8–27)
Bilirubin Total: 0.7 mg/dL (ref 0.0–1.2)
CO2: 19 mmol/L — ABNORMAL LOW (ref 20–29)
Calcium: 10.1 mg/dL (ref 8.7–10.3)
Chloride: 104 mmol/L (ref 96–106)
Creatinine, Ser: 0.69 mg/dL (ref 0.57–1.00)
Globulin, Total: 2.2 g/dL (ref 1.5–4.5)
Glucose: 113 mg/dL — ABNORMAL HIGH (ref 70–99)
Potassium: 4.3 mmol/L (ref 3.5–5.2)
Sodium: 142 mmol/L (ref 134–144)
Total Protein: 7 g/dL (ref 6.0–8.5)
eGFR: 99 mL/min/{1.73_m2} (ref >59)

## 2022-03-04 LAB — CBC WITH DIFFERENTIAL/PLATELET
Basophils Absolute: 0.1 10*3/uL (ref 0.0–0.2)
Basos: 0 %
EOS (ABSOLUTE): 0.1 10*3/uL (ref 0.0–0.4)
Eos: 1 %
Hematocrit: 43.7 % (ref 34.0–46.6)
Hemoglobin: 14.7 g/dL (ref 11.1–15.9)
Immature Grans (Abs): 0.1 10*3/uL (ref 0.0–0.1)
Immature Granulocytes: 1 %
Lymphocytes Absolute: 3.1 10*3/uL (ref 0.7–3.1)
Lymphs: 25 %
MCH: 32.3 pg (ref 26.6–33.0)
MCHC: 33.6 g/dL (ref 31.5–35.7)
MCV: 96 fL (ref 79–97)
Monocytes Absolute: 0.7 10*3/uL (ref 0.1–0.9)
Monocytes: 6 %
Neutrophils Absolute: 8.3 10*3/uL — ABNORMAL HIGH (ref 1.4–7.0)
Neutrophils: 67 %
Platelets: 345 10*3/uL (ref 150–450)
RBC: 4.55 x10E6/uL (ref 3.77–5.28)
RDW: 13.5 % (ref 11.7–15.4)
WBC: 12.3 10*3/uL — ABNORMAL HIGH (ref 3.4–10.8)

## 2022-03-04 LAB — LIPID PANEL
Chol/HDL Ratio: 2.2 ratio (ref 0.0–4.4)
Cholesterol, Total: 180 mg/dL (ref 100–199)
HDL: 82 mg/dL (ref >39)
LDL Chol Calc (NIH): 81 mg/dL (ref 0–99)
Triglycerides: 96 mg/dL (ref 0–149)
VLDL Cholesterol Cal: 17 mg/dL (ref 5–40)

## 2022-03-04 LAB — MICROALBUMIN / CREATININE URINE RATIO
Creatinine, Urine: 37.1 mg/dL
Microalb/Creat Ratio: <8 mg/g creat (ref 0–29)
Microalbumin, Urine: <3 ug/mL

## 2022-03-04 LAB — CARDIOVASCULAR RISK ASSESSMENT

## 2022-03-04 LAB — HEMOGLOBIN A1C
Est. average glucose Bld gHb Est-mCnc: 126 mg/dL
Hgb A1c MFr Bld: 6 % — ABNORMAL HIGH (ref 4.8–5.6)

## 2022-03-05 ENCOUNTER — Other Ambulatory Visit: Payer: Self-pay | Admitting: Family Medicine

## 2022-03-05 DIAGNOSIS — E1169 Type 2 diabetes mellitus with other specified complication: Secondary | ICD-10-CM

## 2022-03-10 ENCOUNTER — Other Ambulatory Visit: Payer: Self-pay | Admitting: Family Medicine

## 2022-03-10 DIAGNOSIS — J410 Simple chronic bronchitis: Secondary | ICD-10-CM

## 2022-04-07 IMAGING — MG MM DIGITAL SCREENING BILAT W/ TOMO AND CAD
6 of 10 series · 6 of 30 positions shown · non-contrast
Comparison: Previous exam(s).

ACR Breast Density Category a: The breast tissue is almost entirely
fatty.

CLINICAL DATA: Screening.

EXAM:
DIGITAL SCREENING BILATERAL MAMMOGRAM WITH TOMOSYNTHESIS AND CAD
TECHNIQUE: Bilateral screening digital craniocaudal and mediolateral oblique
mammograms were obtained. Bilateral screening digital breast
tomosynthesis was performed. The images were evaluated with
computer-aided detection.

[R MLO synth-2D]
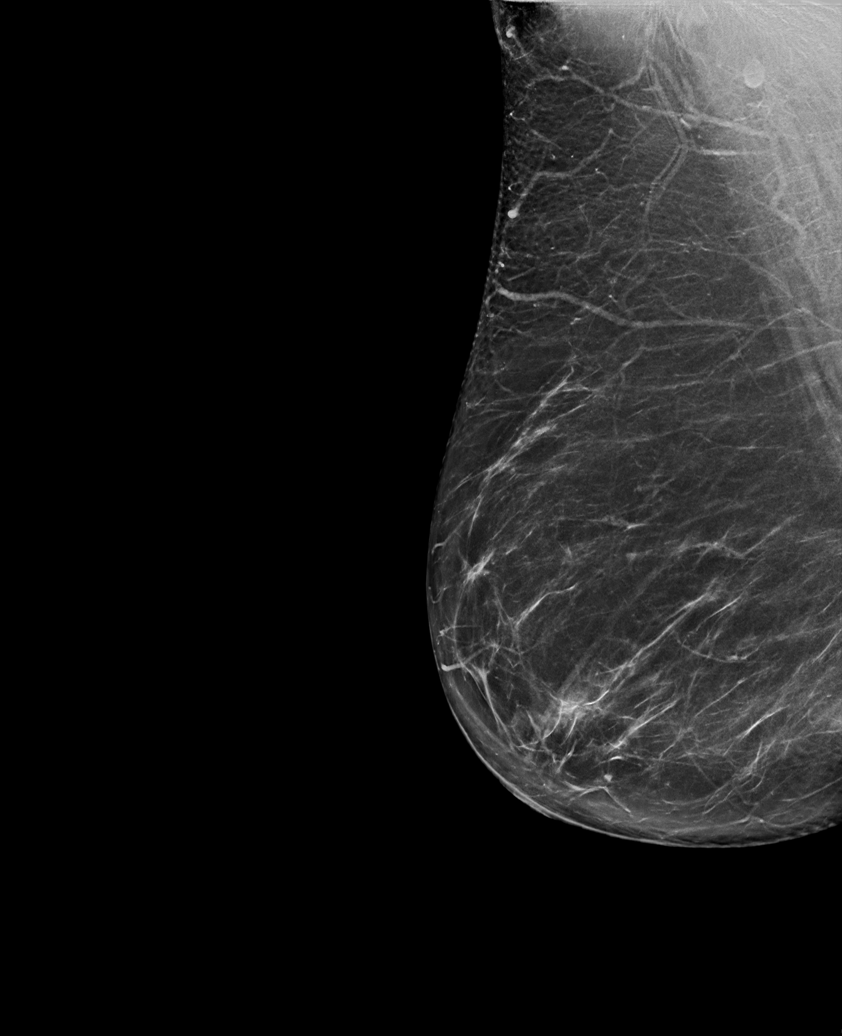

[R XCCL synth-2D]
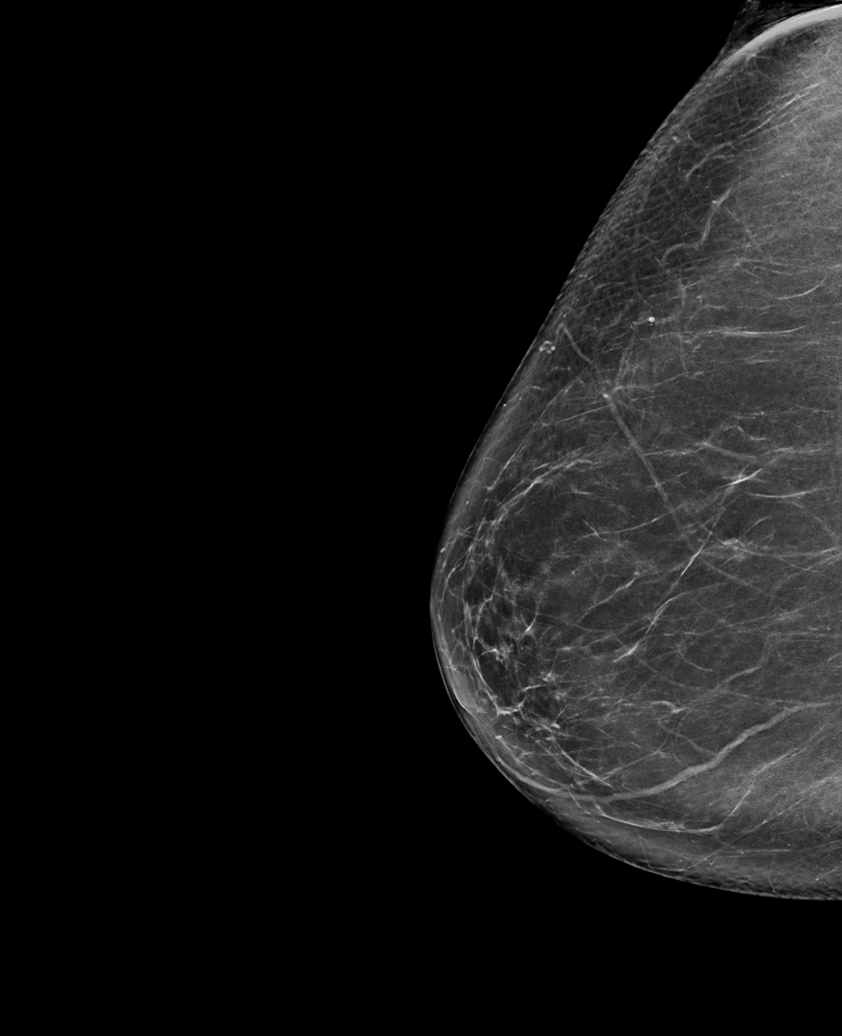

[L MLO synth-2D]
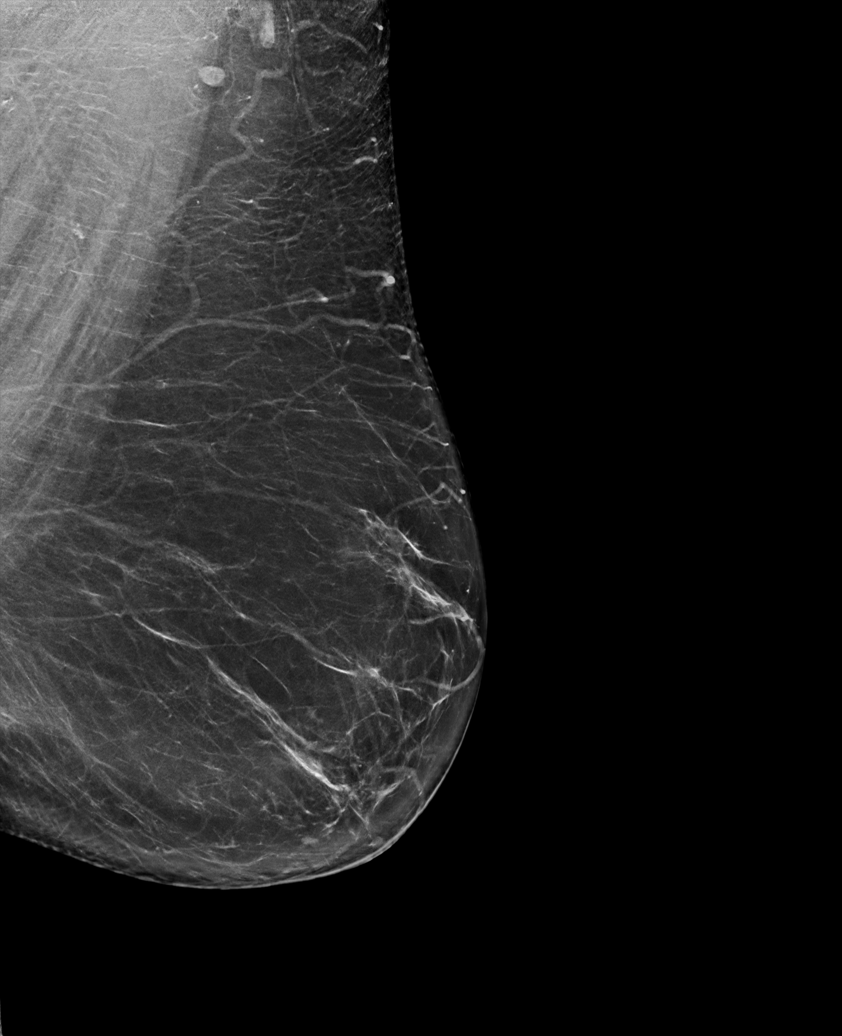

[R CC synth-2D]
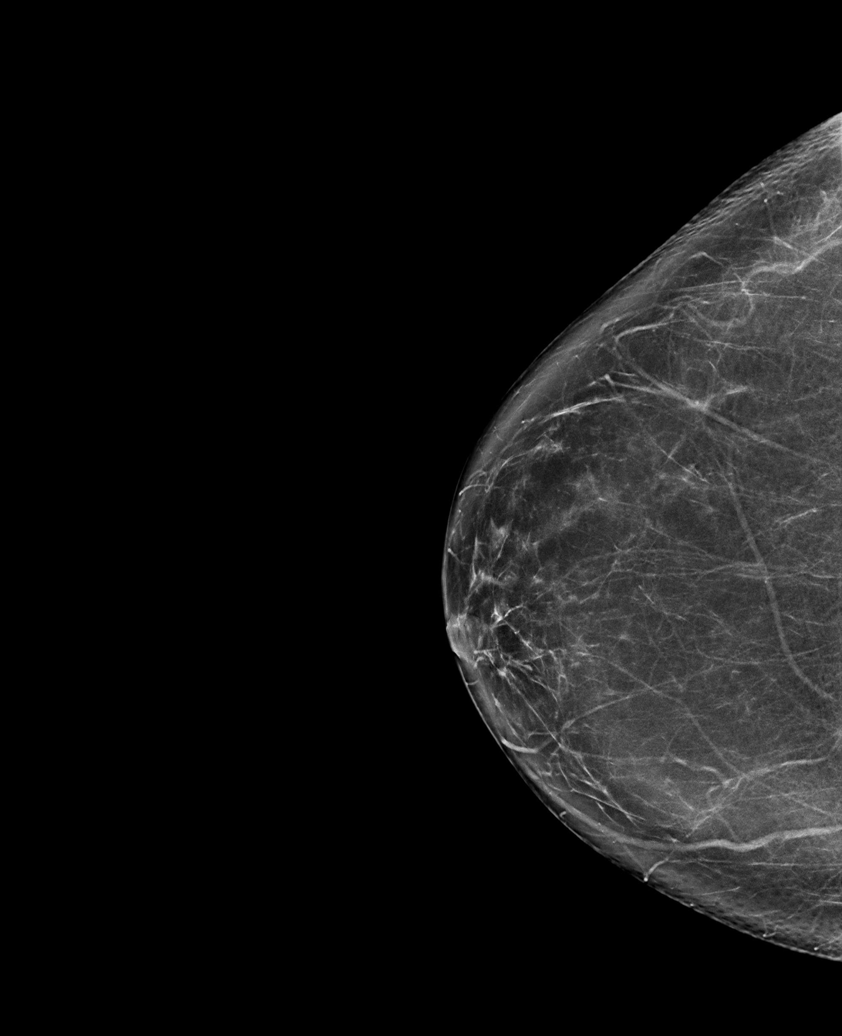

[L CC synth-2D]
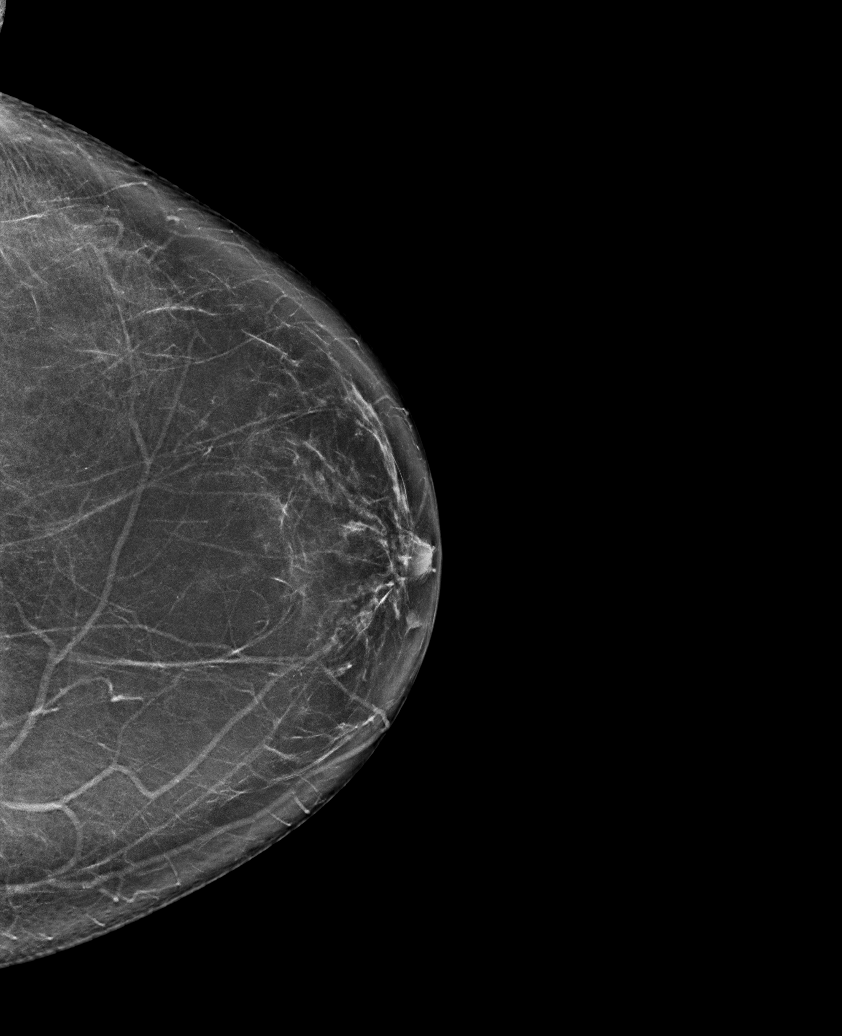

[R XCCL tomo · tomo slice 43/85.0]
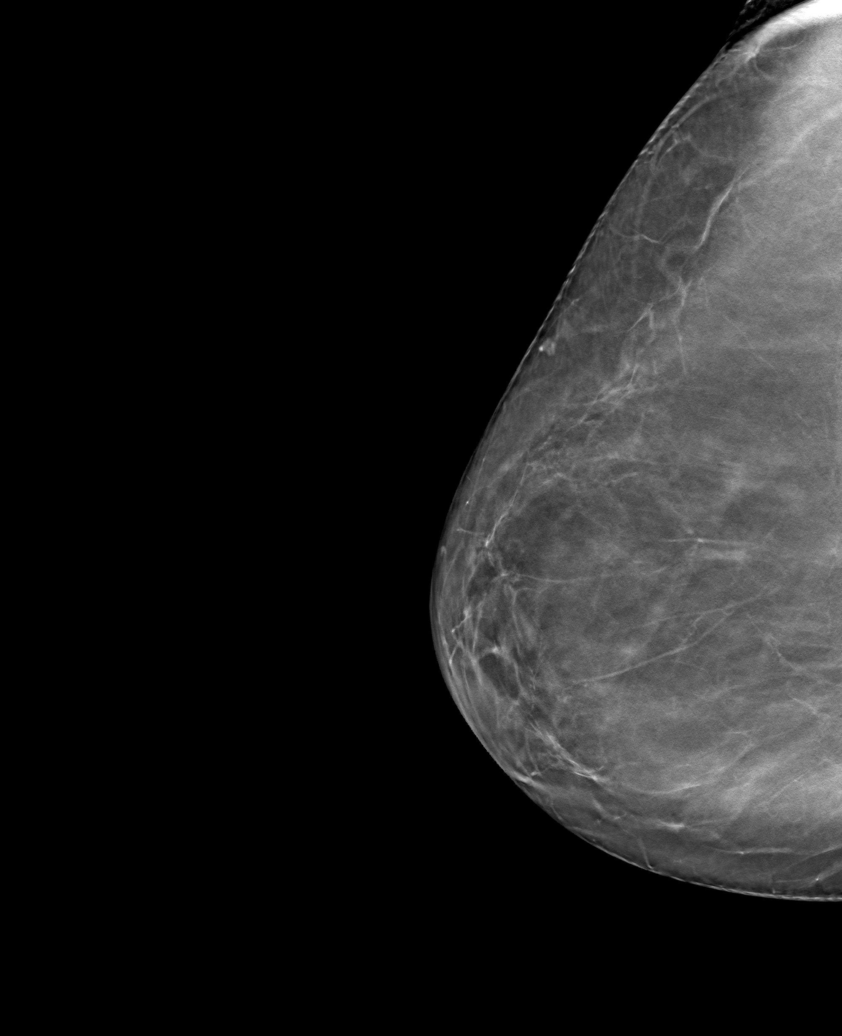

[6 of 30 positions shown; findings below may reference images not displayed]

FINDINGS: There are no findings suspicious for malignancy.
IMPRESSION: No mammographic evidence of malignancy. A result letter of this
screening mammogram will be mailed directly to the patient.

RECOMMENDATION:
Screening mammogram in one year. (Code:0E-3-N98)

BI-RADS CATEGORY  1: Negative.

## 2022-04-13 ENCOUNTER — Other Ambulatory Visit: Payer: Self-pay | Admitting: Family Medicine

## 2022-04-13 DIAGNOSIS — I10 Essential (primary) hypertension: Secondary | ICD-10-CM

## 2022-04-15 ENCOUNTER — Ambulatory Visit
Admission: RE | Admit: 2022-04-15 | Discharge: 2022-04-15 | Disposition: A | Discharge status: Home / Self Care | Payer: BC Managed Care – PPO | Source: Ambulatory Visit | Attending: Family Medicine | Admitting: Family Medicine

## 2022-04-15 DIAGNOSIS — Z1231 Encounter for screening mammogram for malignant neoplasm of breast: Secondary | ICD-10-CM

## 2022-04-21 ENCOUNTER — Telehealth: Payer: Self-pay

## 2022-04-21 NOTE — Telephone Encounter (Signed)
I left a message on the number(s) listed in the patients chart requesting the patient to call back regarding the upcomming appointment for 06/16/2022. The provider is out of the office that day. The appointment has been canceled and rescheduled to July 2 at 8:00 AM Waiting for the patient to return the call.  Also sent the patient a mychart message and mailed a letter.

## 2022-04-29 ENCOUNTER — Other Ambulatory Visit: Payer: Self-pay | Admitting: Family Medicine

## 2022-04-29 DIAGNOSIS — J441 Chronic obstructive pulmonary disease with (acute) exacerbation: Secondary | ICD-10-CM

## 2022-06-16 ENCOUNTER — Ambulatory Visit: Payer: BC Managed Care – PPO | Admitting: Family Medicine

## 2022-07-06 NOTE — Assessment & Plan Note (Signed)
Well controlled.  No changes to medicines. Olmesartan  20 mg daily Continue to work on eating a healthy diet and exercise.  Labs drawn today.  

## 2022-07-06 NOTE — Assessment & Plan Note (Signed)
Well controlled.  No changes to medicines. Lipitor 40 mg daily  Continue to work on eating a healthy diet and exercise.  Labs drawn today.   

## 2022-07-06 NOTE — Assessment & Plan Note (Signed)
Continue cpap.  

## 2022-07-06 NOTE — Progress Notes (Signed)
Subjective:  Patient ID: Kathleen Wells, female    DOB: November 03, 1960  Age: 62 y.o. MRN: [id number]  Chief Complaint  Patient presents with   Medical Management of Chronic Issues    HPI   DMII: No medications-Diet controlled,  check checks feet daily.  Hyperlipidemia: Lipitor 40 mg daily. ASA 81 mg daily.   HTN: Olmesartan  20 mg daily  COPD: Breztri and Albuterol. Continues to smoke. 1/2 ppd.   Sleep apnea:  wears CPAP every night.  Sleep much improved since beginning treatment.        07/07/2022    8:01 AM 03/03/2022    8:03 AM 08/26/2021    8:04 AM 03/02/2021    6:32 PM 08/21/2020    8:17 AM  Depression screen PHQ 2/9  Decreased Interest 0 0 0 0 0  Down, Depressed, Hopeless 1 0 0 0 0  PHQ - 2 Score 1 0 0 0 0  Altered sleeping 1      Tired, decreased energy 0      Change in appetite 0      Feeling bad or failure about yourself  0      Trouble concentrating 0      Moving slowly or fidgety/restless 0      Suicidal thoughts 0      PHQ-9 Score 2      Difficult doing work/chores Not difficult at all          07/07/2022    8:01 AM  GAD 7 : Generalized Anxiety Score  Nervous, Anxious, on Edge 0  Control/stop worrying 0  Worry too much - different things 0  Trouble relaxing 0  Restless 0  Easily annoyed or irritable 0  Afraid - awful might happen 0  Total GAD 7 Score 0  Anxiety Difficulty Not difficult at all       Review of Systems  Constitutional:  Negative for chills, fatigue and fever.  HENT:  Negative for congestion, rhinorrhea and sore throat.   Respiratory:  Negative for cough and shortness of breath.   Cardiovascular:  Negative for chest pain.  Gastrointestinal:  Negative for abdominal pain, constipation, diarrhea, nausea and vomiting.  Genitourinary:  Positive for dysuria. Negative for urgency.  Musculoskeletal:  Negative for back pain and myalgias.  Neurological:  Negative for dizziness, weakness, light-headedness and headaches.  Hematological:   Bruises/bleeds easily.  Psychiatric/Behavioral:  Negative for dysphoric mood. The patient is not nervous/anxious.     Current Outpatient Medications on File Prior to Visit  Medication Sig Dispense Refill   ACCU-CHEK GUIDE test strip USE TO TEST UP TO 4 TIMES DAILY AS DIRECTED 100 strip 2   albuterol (VENTOLIN HFA) 108 (90 Base) MCG/ACT inhaler TAKE 2 PUFFS BY MOUTH EVERY 6 HOURS AS NEEDED FOR WHEEZE OR SHORTNESS OF BREATH 8.5 each 2   aspirin 81 MG chewable tablet Chew 81 mg by mouth daily.     atorvastatin (LIPITOR) 40 MG tablet TAKE 1 TABLET BY MOUTH EVERY DAY 90 tablet 1   BREZTRI AEROSPHERE 160-9-4.8 MCG/ACT AERO INHALE 2 PUFFS INTO THE LUNGS IN THE MORNING AND AT BEDTIME. 10.7 each 11   fluticasone (FLONASE) 50 MCG/ACT nasal spray Place 2 sprays into both nostrils daily. 16 g 6   olmesartan (BENICAR) 20 MG tablet TAKE 1 TABLET BY MOUTH EVERY DAY 90 tablet 1   No current facility-administered medications on file prior to visit.   Past Medical History:  Diagnosis Date   Essential (primary) hypertension  Mixed hyperlipidemia    Obstructive sleep apnea (adult) (pediatric)    Type 2 diabetes mellitus with other specified complication (HCC)    History reviewed. No pertinent surgical history.  Family History  Problem Relation Age of Onset   Lung cancer Mother    Congestive Heart Failure Father    Lupus Sister    Stomach cancer Maternal Grandmother    Hepatitis C Brother    Breast cancer Neg Hx    Social History   Socioeconomic History   Marital status: Media planner    Spouse name: Not on file   Number of children: 0   Years of education: Not on file   Highest education level: Not on file  Occupational History   Occupation: OFFICE  Tobacco Use   Smoking status: Every Day    Packs/day: 0.50    Years: 42.00    Additional pack years: 0.00    Total pack years: 21.00    Types: Cigarettes   Smokeless tobacco: Never  Vaping Use   Vaping Use: Never used  Substance and  Sexual Activity   Alcohol use: Yes    Alcohol/week: 8.0 standard drinks of alcohol    Types: 4 Cans of beer, 4 Standard drinks or equivalent per week   Drug use: Never   Sexual activity: Not Currently  Other Topics Concern   Not on file  Social History Narrative   Not on file   Social Determinants of Health   Financial Resource Strain: Low Risk  (03/02/2021)   Overall Financial Resource Strain (CARDIA)    Difficulty of Paying Living Expenses: Not hard at all  Food Insecurity: No Food Insecurity (03/02/2021)   Hunger Vital Sign    Worried About Running Out of Food in the Last Year: Never true    Ran Out of Food in the Last Year: Never true  Transportation Needs: No Transportation Needs (03/03/2022)   PRAPARE - Administrator, Civil Service (Medical): No    Lack of Transportation (Non-Medical): No  Physical Activity: Insufficiently Active (07/07/2022)   Exercise Vital Sign    Days of Exercise per Week: 7 days    Minutes of Exercise per Session: 20 min  Stress: No Stress Concern Present (03/02/2021)   [employer] of Occupational Health - Occupational Stress Questionnaire    Feeling of Stress : Not at all  Social Connections: Moderately Isolated (03/03/2022)   Social Connection and Isolation Panel [NHANES]    Frequency of Communication with Friends and Family: Three times a week    Frequency of Social Gatherings with Friends and Family: Three times a week    Attends Religious Services: Never    Active Member of Clubs or Organizations: No    Attends Banker Meetings: Never    Marital Status: Living with partner    Objective:  BP 128/74   Pulse 72   Temp (!) 97 F (36.1 C)   Resp 16   Ht 5\' 9"  (1.753 m)   Wt 231 lb (104.8 kg)   BMI 34.11 kg/m      07/07/2022    7:55 AM 03/03/2022    8:00 AM 01/26/2022    2:01 PM  BP/Weight  Systolic BP 128 136 120  Diastolic BP 74 76 70  Wt. (Lbs) 231 234 235  BMI 34.11 kg/m2 34.56 kg/m2 34.7 kg/m2     Physical Exam Vitals reviewed.  Constitutional:      Appearance: Normal appearance. She is normal weight.  Neck:  Vascular: No carotid bruit.  Cardiovascular:     Rate and Rhythm: Normal rate and regular rhythm.     Heart sounds: Normal heart sounds.  Pulmonary:     Effort: Pulmonary effort is normal. No respiratory distress.     Breath sounds: Normal breath sounds.  Abdominal:     General: Abdomen is flat. Bowel sounds are normal.     Palpations: Abdomen is soft.     Tenderness: There is no abdominal tenderness.  Neurological:     Mental Status: She is alert and oriented to person, place, and time.  Psychiatric:        Mood and Affect: Mood normal.        Behavior: Behavior normal.     Diabetic Foot Exam - Simple   Simple Foot Form Visual Inspection No deformities, no ulcerations, no other skin breakdown bilaterally: Yes Sensation Testing Intact to touch and monofilament testing bilaterally: Yes Pulse Check Posterior Tibialis and Dorsalis pulse intact bilaterally: Yes Comments      Lab Results  Component Value Date   WBC 12.3 (H) 03/03/2022   HGB 14.7 03/03/2022   HCT 43.7 03/03/2022   PLT 345 03/03/2022   GLUCOSE 113 (H) 03/03/2022   CHOL 180 03/03/2022   TRIG 96 03/03/2022   HDL 82 03/03/2022   LDLCALC 81 03/03/2022   ALT 27 03/03/2022   AST 20 03/03/2022   NA 142 03/03/2022   K 4.3 03/03/2022   CL 104 03/03/2022   CREATININE 0.69 03/03/2022   BUN 13 03/03/2022   CO2 19 (L) 03/03/2022   TSH 1.040 08/26/2021   HGBA1C 6.0 (H) 03/03/2022   MICROALBUR 10 02/22/2020      Assessment & Plan:    Hypertension associated with diabetes (HCC) Assessment & Plan: Diabetes and hypertension well controlled.  No changes to medicines. Olmesartan  20 mg daily Continue to work on eating a healthy diet and exercise.  Labs drawn today.   Orders: -     CBC with Differential/Platelet -     Comprehensive metabolic panel  Obstructive sleep apnea (adult)  (pediatric) Assessment & Plan: Continue cpap.    Mixed hyperlipidemia Assessment & Plan: Well controlled.  No changes to medicines. Lipitor 40 mg daily Continue to work on eating a healthy diet and exercise.  Labs drawn today.   Orders: -     Lipid panel  Hyperlipidemia associated with type 2 diabetes mellitus (HCC) Assessment & Plan: Continue lipitor 40 mg daily and ASA 81 mg. Healthy diet and exercise.    Orders: -     Hemoglobin A1c  Body mass index (BMI) of 34.0-34.9 in adult Assessment & Plan: Healthy diet and exercise.    Dysuria -     POCT URINALYSIS DIP (CLINITEK) -     Urine Culture -     Nitrofurantoin Monohyd Macro; Take 1 capsule (100 mg total) by mouth 2 (two) times daily.  Dispense: 14 capsule; Refill: 0     Meds ordered this encounter  Medications   nitrofurantoin, macrocrystal-monohydrate, (MACROBID) 100 MG capsule    Sig: Take 1 capsule (100 mg total) by mouth 2 (two) times daily.    Dispense:  14 capsule    Refill:  0    Orders Placed This Encounter  Procedures   Urine Culture   CBC with Differential/Platelet   Comprehensive metabolic panel   Lipid panel   Hemoglobin A1c   POCT URINALYSIS DIP (CLINITEK)     Follow-up: Return in about 3 months (  around 10/07/2022) for chronic follow up, fasting.   I,Marla I Leal-Borjas,acting as a scribe for Blane Ohara, MD.,have documented all relevant documentation on the behalf of Blane Ohara, MD,as directed by  Blane Ohara, MD while in the presence of Blane Ohara, MD.   An After Visit Summary was printed and given to the patient.  Blane Ohara, MD [facility] [phone]

## 2022-07-07 ENCOUNTER — Ambulatory Visit: Payer: BC Managed Care – PPO | Admitting: Family Medicine

## 2022-07-07 ENCOUNTER — Encounter: Payer: Self-pay | Admitting: Family Medicine

## 2022-07-07 VITALS — BP 128/74 | HR 72 | Temp 97.0°F | Resp 16 | Ht 69.0 in | Wt 231.0 lb

## 2022-07-07 DIAGNOSIS — E785 Hyperlipidemia, unspecified: Secondary | ICD-10-CM

## 2022-07-07 DIAGNOSIS — I152 Hypertension secondary to endocrine disorders: Secondary | ICD-10-CM

## 2022-07-07 DIAGNOSIS — E782 Mixed hyperlipidemia: Secondary | ICD-10-CM | POA: Diagnosis not present

## 2022-07-07 DIAGNOSIS — R300 Dysuria: Secondary | ICD-10-CM

## 2022-07-07 DIAGNOSIS — E1159 Type 2 diabetes mellitus with other circulatory complications: Secondary | ICD-10-CM | POA: Diagnosis not present

## 2022-07-07 DIAGNOSIS — G4733 Obstructive sleep apnea (adult) (pediatric): Secondary | ICD-10-CM | POA: Diagnosis not present

## 2022-07-07 DIAGNOSIS — Z6834 Body mass index (BMI) 34.0-34.9, adult: Secondary | ICD-10-CM

## 2022-07-07 DIAGNOSIS — E1169 Type 2 diabetes mellitus with other specified complication: Secondary | ICD-10-CM

## 2022-07-07 HISTORY — DX: Type 2 diabetes mellitus with other specified complication: E11.69

## 2022-07-07 HISTORY — DX: Hyperlipidemia, unspecified: E78.5

## 2022-07-07 LAB — COMPREHENSIVE METABOLIC PANEL
ALT: 27 IU/L (ref 0–32)
AST: 20 IU/L (ref 0–40)
Albumin: 4.5 g/dL (ref 3.9–4.9)
Alkaline Phosphatase: 113 IU/L (ref 44–121)
BUN/Creatinine Ratio: 20 (ref 12–28)
BUN: 12 mg/dL (ref 8–27)
Bilirubin Total: 0.7 mg/dL (ref 0.0–1.2)
CO2: 20 mmol/L (ref 20–29)
Calcium: 10.1 mg/dL (ref 8.7–10.3)
Chloride: 105 mmol/L (ref 96–106)
Creatinine, Ser: 0.61 mg/dL (ref 0.57–1.00)
Globulin, Total: 2.4 g/dL (ref 1.5–4.5)
Glucose: 107 mg/dL — ABNORMAL HIGH (ref 70–99)
Potassium: 4.6 mmol/L (ref 3.5–5.2)
Sodium: 141 mmol/L (ref 134–144)
Total Protein: 6.9 g/dL (ref 6.0–8.5)
eGFR: 102 mL/min/{1.73_m2} (ref >59)

## 2022-07-07 LAB — POCT URINALYSIS DIP (CLINITEK)
Bilirubin, UA: NEGATIVE
Blood, UA: NEGATIVE
Glucose, UA: NEGATIVE mg/dL
Ketones, POC UA: NEGATIVE mg/dL
Nitrite, UA: NEGATIVE
POC PROTEIN,UA: NEGATIVE
Spec Grav, UA: 1.015 (ref 1.010–1.025)
Urobilinogen, UA: 0.2 E.U./dL
pH, UA: 5.5 (ref 5.0–8.0)

## 2022-07-07 LAB — CBC WITH DIFFERENTIAL/PLATELET
Basophils Absolute: 0 10*3/uL (ref 0.0–0.2)
Basos: 0 %
EOS (ABSOLUTE): 0.2 10*3/uL (ref 0.0–0.4)
Eos: 2 %
Hematocrit: 42.6 % (ref 34.0–46.6)
Hemoglobin: 14.3 g/dL (ref 11.1–15.9)
Immature Grans (Abs): 0.1 10*3/uL (ref 0.0–0.1)
Immature Granulocytes: 1 %
Lymphocytes Absolute: 2.2 10*3/uL (ref 0.7–3.1)
Lymphs: 19 %
MCH: 32.2 pg (ref 26.6–33.0)
MCHC: 33.6 g/dL (ref 31.5–35.7)
MCV: 96 fL (ref 79–97)
Monocytes Absolute: 0.7 10*3/uL (ref 0.1–0.9)
Monocytes: 6 %
Neutrophils Absolute: 8.8 10*3/uL — ABNORMAL HIGH (ref 1.4–7.0)
Neutrophils: 72 %
Platelets: 303 10*3/uL (ref 150–450)
RBC: 4.44 x10E6/uL (ref 3.77–5.28)
RDW: 11.9 % (ref 11.7–15.4)
WBC: 12 10*3/uL — ABNORMAL HIGH (ref 3.4–10.8)

## 2022-07-07 LAB — LIPID PANEL
Chol/HDL Ratio: 2.1 ratio (ref 0.0–4.4)
Cholesterol, Total: 158 mg/dL (ref 100–199)
HDL: 76 mg/dL (ref >39)
LDL Chol Calc (NIH): 65 mg/dL (ref 0–99)
Triglycerides: 91 mg/dL (ref 0–149)
VLDL Cholesterol Cal: 17 mg/dL (ref 5–40)

## 2022-07-07 LAB — HEMOGLOBIN A1C
Est. average glucose Bld gHb Est-mCnc: 123 mg/dL
Hgb A1c MFr Bld: 5.9 % — ABNORMAL HIGH (ref 4.8–5.6)

## 2022-07-07 MED ORDER — NITROFURANTOIN MONOHYD MACRO 100 MG PO CAPS
100.0000 mg | ORAL_CAPSULE | Freq: Two times a day (BID) | ORAL | 0 refills | Status: DC
Start: 2022-07-07 — End: 2023-03-05

## 2022-07-07 NOTE — Assessment & Plan Note (Signed)
Continue lipitor 40 mg daily and ASA 81 mg. Healthy diet and exercise.

## 2022-07-07 NOTE — Assessment & Plan Note (Signed)
Healthy diet and exercise.  

## 2022-07-08 LAB — URINE CULTURE: Organism ID, Bacteria: NO GROWTH

## 2022-07-20 ENCOUNTER — Other Ambulatory Visit: Payer: Self-pay | Admitting: Family Medicine

## 2022-07-20 DIAGNOSIS — J441 Chronic obstructive pulmonary disease with (acute) exacerbation: Secondary | ICD-10-CM

## 2022-07-28 ENCOUNTER — Other Ambulatory Visit: Payer: Self-pay

## 2022-07-28 DIAGNOSIS — J209 Acute bronchitis, unspecified: Secondary | ICD-10-CM

## 2022-07-28 DIAGNOSIS — J0100 Acute maxillary sinusitis, unspecified: Secondary | ICD-10-CM

## 2022-07-28 DIAGNOSIS — R051 Acute cough: Secondary | ICD-10-CM

## 2022-07-28 MED ORDER — FLUTICASONE PROPIONATE 50 MCG/ACT NA SUSP
2.0000 | Freq: Every day | NASAL | 6 refills | Status: DC
Start: 2022-07-28 — End: 2023-03-19

## 2022-08-30 ENCOUNTER — Other Ambulatory Visit: Payer: Self-pay | Admitting: Family Medicine

## 2022-08-30 DIAGNOSIS — E1169 Type 2 diabetes mellitus with other specified complication: Secondary | ICD-10-CM

## 2022-10-07 ENCOUNTER — Other Ambulatory Visit: Payer: Self-pay | Admitting: Family Medicine

## 2022-10-07 DIAGNOSIS — I10 Essential (primary) hypertension: Secondary | ICD-10-CM

## 2022-10-26 NOTE — Assessment & Plan Note (Signed)
Control: cholesterol and diabetes well controlled.  Recommend check sugars fasting daily. Recommend check feet daily. Recommend annual eye exams. Medicines: Lipitor and aspirin.  Continue to work on eating a healthy diet and exercise.  Labs drawn today.

## 2022-10-26 NOTE — Progress Notes (Unsigned)
Subjective:  Patient ID: Kathleen Wells, female    DOB: July 19, 1960  Age: 62 y.o. MRN: [id number]  No chief complaint on file.   HPI   DMII: No medications-Diet controlled,  check checks feet daily.  Hyperlipidemia: Lipitor 40 mg daily. ASA 81 mg daily.   HTN: Olmesartan  20 mg daily  COPD: Breztri and Albuterol. Continues to smoke. 1/2 ppd.   Sleep apnea:  wears CPAP every night.  Sleep much improved since beginning treatment.      07/07/2022    8:01 AM 03/03/2022    8:03 AM 08/26/2021    8:04 AM 03/02/2021    6:32 PM 08/21/2020    8:17 AM  Depression screen PHQ 2/9  Decreased Interest 0 0 0 0 0  Down, Depressed, Hopeless 1 0 0 0 0  PHQ - 2 Score 1 0 0 0 0  Altered sleeping 1      Tired, decreased energy 0      Change in appetite 0      Feeling bad or failure about yourself  0      Trouble concentrating 0      Moving slowly or fidgety/restless 0      Suicidal thoughts 0      PHQ-9 Score 2      Difficult doing work/chores Not difficult at all            03/03/2022    8:03 AM  Fall Risk   Falls in the past year? 0  Number falls in past yr: 0  Injury with Fall? 0  Risk for fall due to : No Fall Risks  Follow up Falls evaluation completed    Patient Care Team: Verl Whitmore, Fritzi Mandes, MD as PCP - General (Family Medicine)   Review of Systems  Current Outpatient Medications on File Prior to Visit  Medication Sig Dispense Refill   ACCU-CHEK GUIDE test strip USE TO TEST UP TO 4 TIMES DAILY AS DIRECTED 100 strip 2   albuterol (VENTOLIN HFA) 108 (90 Base) MCG/ACT inhaler TAKE 2 PUFFS BY MOUTH EVERY 6 HOURS AS NEEDED FOR WHEEZE OR SHORTNESS OF BREATH 8.5 each 2   aspirin 81 MG chewable tablet Chew 81 mg by mouth daily.     atorvastatin (LIPITOR) 40 MG tablet TAKE 1 TABLET BY MOUTH EVERY DAY 90 tablet 1   BREZTRI AEROSPHERE 160-9-4.8 MCG/ACT AERO INHALE 2 PUFFS INTO THE LUNGS IN THE MORNING AND AT BEDTIME. 10.7 each 11   fluticasone (FLONASE) 50 MCG/ACT nasal spray Place 2 sprays  into both nostrils daily. 16 g 6   nitrofurantoin, macrocrystal-monohydrate, (MACROBID) 100 MG capsule Take 1 capsule (100 mg total) by mouth 2 (two) times daily. 14 capsule 0   olmesartan (BENICAR) 20 MG tablet TAKE 1 TABLET BY MOUTH EVERY DAY 90 tablet 1   No current facility-administered medications on file prior to visit.   Past Medical History:  Diagnosis Date   Essential (primary) hypertension    Mixed hyperlipidemia    Obstructive sleep apnea (adult) (pediatric)    Type 2 diabetes mellitus with other specified complication (HCC)    No past surgical history on file.  Family History  Problem Relation Age of Onset   Lung cancer Mother    Congestive Heart Failure Father    Lupus Sister    Stomach cancer Maternal Grandmother    Hepatitis C Brother    Breast cancer Neg Hx    Social History   Socioeconomic History   Marital status: Domestic  Partner    Spouse name: Not on file   Number of children: 0   Years of education: Not on file   Highest education level: Not on file  Occupational History   Occupation: OFFICE  Tobacco Use   Smoking status: Every Day    Current packs/day: 0.50    Average packs/day: 0.5 packs/day for 42.0 years (21.0 ttl pk-yrs)    Types: Cigarettes   Smokeless tobacco: Never  Vaping Use   Vaping status: Never Used  Substance and Sexual Activity   Alcohol use: Yes    Alcohol/week: 8.0 standard drinks of alcohol    Types: 4 Cans of beer, 4 Standard drinks or equivalent per week   Drug use: Never   Sexual activity: Not Currently  Other Topics Concern   Not on file  Social History Narrative   Not on file   Social Determinants of Health   Financial Resource Strain: Low Risk  (03/02/2021)   Overall Financial Resource Strain (CARDIA)    Difficulty of Paying Living Expenses: Not hard at all  Food Insecurity: No Food Insecurity (03/02/2021)   Hunger Vital Sign    Worried About Running Out of Food in the Last Year: Never true    Ran Out of Food in  the Last Year: Never true  Transportation Needs: No Transportation Needs (03/03/2022)   PRAPARE - Administrator, Civil Service (Medical): No    Lack of Transportation (Non-Medical): No  Physical Activity: Insufficiently Active (07/07/2022)   Exercise Vital Sign    Days of Exercise per Week: 7 days    Minutes of Exercise per Session: 20 min  Stress: No Stress Concern Present (03/02/2021)   [employer] of Occupational Health - Occupational Stress Questionnaire    Feeling of Stress : Not at all  Social Connections: Moderately Isolated (03/03/2022)   Social Connection and Isolation Panel [NHANES]    Frequency of Communication with Friends and Family: Three times a week    Frequency of Social Gatherings with Friends and Family: Three times a week    Attends Religious Services: Never    Active Member of Clubs or Organizations: No    Attends Banker Meetings: Never    Marital Status: Living with partner    Objective:  There were no vitals taken for this visit.     07/07/2022    7:55 AM 03/03/2022    8:00 AM 01/26/2022    2:01 PM  BP/Weight  Systolic BP 128 136 120  Diastolic BP 74 76 70  Wt. (Lbs) 231 234 235  BMI 34.11 kg/m2 34.56 kg/m2 34.7 kg/m2    Physical Exam  Diabetic Foot Exam - Simple   No data filed      Lab Results  Component Value Date   WBC 12.0 (H) 07/07/2022   HGB 14.3 07/07/2022   HCT 42.6 07/07/2022   PLT 303 07/07/2022   GLUCOSE 107 (H) 07/07/2022   CHOL 158 07/07/2022   TRIG 91 07/07/2022   HDL 76 07/07/2022   LDLCALC 65 07/07/2022   ALT 27 07/07/2022   AST 20 07/07/2022   NA 141 07/07/2022   K 4.6 07/07/2022   CL 105 07/07/2022   CREATININE 0.61 07/07/2022   BUN 12 07/07/2022   CO2 20 07/07/2022   TSH 1.040 08/26/2021   HGBA1C 5.9 (H) 07/07/2022   MICROALBUR 10 02/22/2020      Assessment & Plan:    Hypertension associated with diabetes [facility]) Assessment & Plan: Diabetes  and hypertension well controlled.  No  changes to medicines. Olmesartan  20 mg daily Continue to work on eating a healthy diet and exercise.  Labs drawn today.    Obstructive sleep apnea (adult) (pediatric) Assessment & Plan: Continue cpap.    Hyperlipidemia associated with type 2 diabetes mellitus (HCC) Assessment & Plan: Control:  Recommend check sugars fasting daily. Recommend check feet daily. Recommend annual eye exams. Medicines: no medicine Continue to work on eating a healthy diet and exercise.  Labs drawn today.      Mixed hyperlipidemia Assessment & Plan: Well controlled.  No changes to medicines. Lipitor 40 mg daily Continue to work on eating a healthy diet and exercise.  Labs drawn today.    Cigarette nicotine dependence with other nicotine-induced disorder Assessment & Plan: Recommend cessation. Recommend slow wean.      No orders of the defined types were placed in this encounter.   No orders of the defined types were placed in this encounter.    Follow-up: No follow-ups on file.   I,Marla I Leal-Borjas,acting as a scribe for Blane Ohara, MD.,have documented all relevant documentation on the behalf of Blane Ohara, MD,as directed by  Blane Ohara, MD while in the presence of Blane Ohara, MD.   An After Visit Summary was printed and given to the patient.  Blane Ohara, MD [facility] [phone]

## 2022-10-26 NOTE — Assessment & Plan Note (Signed)
Recommend cessation. Recommend slow wean.

## 2022-10-26 NOTE — Assessment & Plan Note (Signed)
Diabetes and hypertension well controlled.  No changes to medicines. Olmesartan  20 mg daily Continue to work on eating a healthy diet and exercise.  Labs drawn today.

## 2022-10-26 NOTE — Assessment & Plan Note (Signed)
Well controlled.  No changes to medicines. Lipitor 40 mg daily  Continue to work on eating a healthy diet and exercise.  Labs drawn today.   

## 2022-10-26 NOTE — Assessment & Plan Note (Signed)
Continue cpap.  

## 2022-10-27 ENCOUNTER — Encounter: Payer: Self-pay | Admitting: Family Medicine

## 2022-10-27 ENCOUNTER — Ambulatory Visit: Payer: BC Managed Care – PPO | Admitting: Family Medicine

## 2022-10-27 VITALS — BP 136/68 | HR 72 | Temp 97.6°F | Ht 69.0 in | Wt 228.0 lb

## 2022-10-27 DIAGNOSIS — E1169 Type 2 diabetes mellitus with other specified complication: Secondary | ICD-10-CM | POA: Diagnosis not present

## 2022-10-27 DIAGNOSIS — I700 Atherosclerosis of aorta: Secondary | ICD-10-CM | POA: Insufficient documentation

## 2022-10-27 DIAGNOSIS — G4733 Obstructive sleep apnea (adult) (pediatric): Secondary | ICD-10-CM

## 2022-10-27 DIAGNOSIS — Z23 Encounter for immunization: Secondary | ICD-10-CM | POA: Insufficient documentation

## 2022-10-27 DIAGNOSIS — Z6833 Body mass index (BMI) 33.0-33.9, adult: Secondary | ICD-10-CM

## 2022-10-27 DIAGNOSIS — E782 Mixed hyperlipidemia: Secondary | ICD-10-CM

## 2022-10-27 DIAGNOSIS — E66811 Obesity, class 1: Secondary | ICD-10-CM

## 2022-10-27 DIAGNOSIS — F17218 Nicotine dependence, cigarettes, with other nicotine-induced disorders: Secondary | ICD-10-CM | POA: Diagnosis not present

## 2022-10-27 DIAGNOSIS — E1159 Type 2 diabetes mellitus with other circulatory complications: Secondary | ICD-10-CM

## 2022-10-27 DIAGNOSIS — I152 Hypertension secondary to endocrine disorders: Secondary | ICD-10-CM

## 2022-10-27 DIAGNOSIS — E785 Hyperlipidemia, unspecified: Secondary | ICD-10-CM

## 2022-10-27 HISTORY — DX: Body mass index (BMI) 33.0-33.9, adult: Z68.33

## 2022-10-27 HISTORY — DX: Obesity, class 1: E66.811

## 2022-10-27 LAB — CBC WITH DIFFERENTIAL/PLATELET
Basophils Absolute: 0 10*3/uL (ref 0.0–0.2)
Basos: 0 %
EOS (ABSOLUTE): 0.2 10*3/uL (ref 0.0–0.4)
Eos: 2 %
Hematocrit: 45.9 % (ref 34.0–46.6)
Hemoglobin: 15 g/dL (ref 11.1–15.9)
Immature Grans (Abs): 0 10*3/uL (ref 0.0–0.1)
Immature Granulocytes: 0 %
Lymphocytes Absolute: 2.7 10*3/uL (ref 0.7–3.1)
Lymphs: 26 %
MCH: 32.4 pg (ref 26.6–33.0)
MCHC: 32.7 g/dL (ref 31.5–35.7)
MCV: 99 fL — ABNORMAL HIGH (ref 79–97)
Monocytes Absolute: 0.6 10*3/uL (ref 0.1–0.9)
Monocytes: 6 %
Neutrophils Absolute: 6.8 10*3/uL (ref 1.4–7.0)
Neutrophils: 66 %
Platelets: 320 10*3/uL (ref 150–450)
RBC: 4.63 x10E6/uL (ref 3.77–5.28)
RDW: 12.4 % (ref 11.7–15.4)
WBC: 10.3 10*3/uL (ref 3.4–10.8)

## 2022-10-27 LAB — HEMOGLOBIN A1C
Est. average glucose Bld gHb Est-mCnc: 117 mg/dL
Hgb A1c MFr Bld: 5.7 % — ABNORMAL HIGH (ref 4.8–5.6)

## 2022-10-27 NOTE — Assessment & Plan Note (Signed)
Recommend continue to work on eating healthy diet and exercise.  

## 2022-10-27 NOTE — Assessment & Plan Note (Signed)
The current medical regimen is effective;  continue present plan and medications. Continue Lipitor 40 mg daily. ASA 81 mg daily.  Recommend quit smoking.

## 2023-01-13 ENCOUNTER — Telehealth: Payer: Self-pay

## 2023-01-13 NOTE — Telephone Encounter (Signed)
 Called patient, left message for patient to call back and let us know what company she gets her supplies from

## 2023-01-13 NOTE — Telephone Encounter (Signed)
 Copied from CRM [id number]. Topic: Clinical - Prescription Issue >> Jan 13, 2023 10:24 AM Gildardo Pounds wrote: Reason for CRM:  Patient needs a new prescription for tubing and filters for C-Pap machine. Callback number is [phone] or cell [phone].

## 2023-01-13 NOTE — Telephone Encounter (Signed)
 Information has been faxed to [facility]

## 2023-01-13 NOTE — Telephone Encounter (Signed)
 Copied from CRM [id number]. Topic: General - Call Back - No Documentation >> Jan 13, 2023 10:46 AM Curlee DEL wrote: Reason for CRM: Patient is calling back to provide the company that she gets her CPAP supplies from:  [employer] [facility] in [place], [place] - Phone number is [phone].   Please reach out to patient if you need any additional information  Callback number is [phone] or cell [phone].

## 2023-03-03 ENCOUNTER — Other Ambulatory Visit: Payer: Self-pay

## 2023-03-03 DIAGNOSIS — E1169 Type 2 diabetes mellitus with other specified complication: Secondary | ICD-10-CM

## 2023-03-05 ENCOUNTER — Ambulatory Visit: Payer: Self-pay | Admitting: Family Medicine

## 2023-03-05 ENCOUNTER — Encounter: Payer: Self-pay | Admitting: Family Medicine

## 2023-03-05 VITALS — BP 120/64 | HR 80 | Temp 97.6°F | Resp 16 | Ht 69.0 in | Wt 228.4 lb

## 2023-03-05 DIAGNOSIS — H8113 Benign paroxysmal vertigo, bilateral: Secondary | ICD-10-CM

## 2023-03-05 DIAGNOSIS — Z1231 Encounter for screening mammogram for malignant neoplasm of breast: Secondary | ICD-10-CM | POA: Diagnosis not present

## 2023-03-05 DIAGNOSIS — R42 Dizziness and giddiness: Secondary | ICD-10-CM | POA: Insufficient documentation

## 2023-03-05 DIAGNOSIS — H66002 Acute suppurative otitis media without spontaneous rupture of ear drum, left ear: Secondary | ICD-10-CM

## 2023-03-05 HISTORY — DX: Dizziness and giddiness: R42

## 2023-03-05 HISTORY — DX: Acute suppurative otitis media without spontaneous rupture of ear drum, left ear: H66.002

## 2023-03-05 HISTORY — DX: Benign paroxysmal vertigo, bilateral: H81.13

## 2023-03-05 LAB — CBC WITH DIFFERENTIAL/PLATELET
Basophils Absolute: 0.1 10*3/uL (ref 0.0–0.2)
Basos: 1 %
EOS (ABSOLUTE): 0.2 10*3/uL (ref 0.0–0.4)
Eos: 2 %
Hematocrit: 44.4 % (ref 34.0–46.6)
Hemoglobin: 14.6 g/dL (ref 11.1–15.9)
Immature Grans (Abs): 0 10*3/uL (ref 0.0–0.1)
Immature Granulocytes: 0 %
Lymphocytes Absolute: 2.5 10*3/uL (ref 0.7–3.1)
Lymphs: 24 %
MCH: 32 pg (ref 26.6–33.0)
MCHC: 32.9 g/dL (ref 31.5–35.7)
MCV: 97 fL (ref 79–97)
Monocytes Absolute: 0.7 10*3/uL (ref 0.1–0.9)
Monocytes: 6 %
Neutrophils Absolute: 7 10*3/uL (ref 1.4–7.0)
Neutrophils: 67 %
Platelets: 347 10*3/uL (ref 150–450)
RBC: 4.56 x10E6/uL (ref 3.77–5.28)
RDW: 12.5 % (ref 11.7–15.4)
WBC: 10.4 10*3/uL (ref 3.4–10.8)

## 2023-03-05 MED ORDER — MECLIZINE HCL 25 MG PO TABS: 25.0000 mg | ORAL_TABLET | Freq: Three times a day (TID) | ORAL | 0 refills | Status: DC | PRN

## 2023-03-05 MED ORDER — OFLOXACIN 0.3 % OT SOLN
5.0000 [drp] | Freq: Every day | OTIC | 0 refills | Status: AC
Start: 2023-03-05 — End: 2023-03-12

## 2023-03-05 NOTE — Assessment & Plan Note (Signed)
 Acute Recurrent episodes of vertigo, worse with head movement. No recent changes in medication or illness. Possible association with sinus congestion. No recent head injury. - Prescribe Meclizine up to three times a day as needed for dizziness. - Urgent referral to ENT for evaluation and possible Epley maneuver.

## 2023-03-05 NOTE — Progress Notes (Signed)
 Acute Office Visit  Subjective:    Patient ID: Kathleen Wells, female    DOB: 1960-01-25, 63 y.o.   MRN: [id number]  Chief Complaint  Patient presents with   Dizziness    Discussed the use of AI scribe software for clinical note transcription with the patient, who gave verbal consent to proceed.  HPI: Kathleen Wells is a 63 year old female who presents with dizziness and vertigo. She is accompanied by her husband, who drove her to the appointment.  She has been experiencing dizziness and vertigo, described as a spinning sensation, particularly when lying in bed and turning too quickly. These symptoms have been present since February 25th and 26th, with a similar episode occurring about a year ago. The dizziness is more severe now, impacting her ability to move freely, such as turning her head during meetings. No recent illness, fever, or significant changes in her medications. No falls, blurred vision, or significant headaches, aside from sinus-related discomfort. She is concerned about her ability to drive due to the dizziness and has taken time off work to manage her symptoms. She is cautious about moving her head and describes needing to move slowly to avoid exacerbating the dizziness.  She mentions anxiety related to her symptoms and a recent friend's passing from a brain tumor, which has heightened her concern.  She uses Flonase for sinus issues, which helps with her allergies. No recent head injuries.  She mentions a history of urinary tract infections, which she attributes to inadequate water intake. She also mentions having diabetes, which requires her to urinate frequently.   Past Medical History:  Diagnosis Date   Essential (primary) hypertension    Mixed hyperlipidemia    Obstructive sleep apnea (adult) (pediatric)    Type 2 diabetes mellitus with other specified complication (HCC)     History reviewed. No pertinent surgical history.  Family History  Problem Relation  Age of Onset   Lung cancer Mother    Congestive Heart Failure Father    Lupus Sister    Stomach cancer Maternal Grandmother    Hepatitis C Brother    Breast cancer Neg Hx     Social History   Socioeconomic History   Marital status: Media planner    Spouse name: Not on file   Number of children: 0   Years of education: Not on file   Highest education level: Not on file  Occupational History   Occupation: OFFICE  Tobacco Use   Smoking status: Every Day    Current packs/day: 0.50    Average packs/day: 0.5 packs/day for 42.0 years (21.0 ttl pk-yrs)    Types: Cigarettes   Smokeless tobacco: Never  Vaping Use   Vaping status: Never Used  Substance and Sexual Activity   Alcohol use: Yes    Alcohol/week: 8.0 standard drinks of alcohol    Types: 4 Cans of beer, 4 Standard drinks or equivalent per week   Drug use: Never   Sexual activity: Not Currently  Other Topics Concern   Not on file  Social History Narrative   Not on file   Social Drivers of Health   Financial Resource Strain: Low Risk  (03/02/2021)   Overall Financial Resource Strain (CARDIA)    Difficulty of Paying Living Expenses: Not hard at all  Food Insecurity: No Food Insecurity (03/02/2021)   Hunger Vital Sign    Worried About Running Out of Food in the Last Year: Never true    Ran Out of Food in the  Last Year: Never true  Transportation Needs: No Transportation Needs (03/03/2022)   PRAPARE - Administrator, Civil Service (Medical): No    Lack of Transportation (Non-Medical): No  Physical Activity: Insufficiently Active (07/07/2022)   Exercise Vital Sign    Days of Exercise per Week: 7 days    Minutes of Exercise per Session: 20 min  Stress: No Stress Concern Present (03/02/2021)   [employer] of Occupational Health - Occupational Stress Questionnaire    Feeling of Stress : Not at all  Social Connections: Moderately Isolated (03/03/2022)   Social Connection and Isolation Panel [NHANES]     Frequency of Communication with Friends and Family: Three times a week    Frequency of Social Gatherings with Friends and Family: Three times a week    Attends Religious Services: Never    Active Member of Clubs or Organizations: No    Attends Banker Meetings: Never    Marital Status: Living with partner  Intimate Partner Violence: Not At Risk (11/22/2019)   Humiliation, Afraid, Rape, and Kick questionnaire    Fear of Current or Ex-Partner: No    Emotionally Abused: No    Physically Abused: No    Sexually Abused: No    Outpatient Medications Prior to Visit  Medication Sig Dispense Refill   ACCU-CHEK GUIDE test strip USE TO TEST UP TO 4 TIMES DAILY AS DIRECTED 100 strip 2   albuterol (VENTOLIN HFA) 108 (90 Base) MCG/ACT inhaler TAKE 2 PUFFS BY MOUTH EVERY 6 HOURS AS NEEDED FOR WHEEZE OR SHORTNESS OF BREATH 8.5 each 2   aspirin 81 MG chewable tablet Chew 81 mg by mouth daily.     atorvastatin (LIPITOR) 40 MG tablet TAKE 1 TABLET BY MOUTH EVERY DAY 90 tablet 1   BREZTRI AEROSPHERE 160-9-4.8 MCG/ACT AERO INHALE 2 PUFFS INTO THE LUNGS IN THE MORNING AND AT BEDTIME. 10.7 each 11   fluticasone (FLONASE) 50 MCG/ACT nasal spray Place 2 sprays into both nostrils daily. 16 g 6   olmesartan (BENICAR) 20 MG tablet TAKE 1 TABLET BY MOUTH EVERY DAY 90 tablet 1   nitrofurantoin, macrocrystal-monohydrate, (MACROBID) 100 MG capsule Take 1 capsule (100 mg total) by mouth 2 (two) times daily. 14 capsule 0   No facility-administered medications prior to visit.    Allergies  Allergen Reactions   Lisinopril Diarrhea   Septra [Sulfamethoxazole-Trimethoprim] Hives   Sulfa Antibiotics Hives    Review of Systems  Constitutional:  Negative for chills, diaphoresis, fatigue and fever.  HENT:  Positive for congestion. Negative for ear pain and sinus pain.   Respiratory:  Negative for cough, chest tightness, shortness of breath and wheezing.   Cardiovascular:  Negative for chest pain and  palpitations.  Gastrointestinal:  Negative for abdominal pain, constipation, diarrhea, nausea and vomiting.  Genitourinary:  Negative for dysuria.  Musculoskeletal:  Negative for arthralgias.  Neurological:  Positive for dizziness and light-headedness. Negative for weakness and headaches.  Psychiatric/Behavioral:  Negative for dysphoric mood. The patient is not nervous/anxious.        Objective:        03/05/2023    8:45 AM 10/27/2022    8:15 AM 07/07/2022    7:55 AM  Vitals with BMI  Height 5\' 9"  5\' 9"  5\' 9"   Weight 228 lbs 6 oz 228 lbs 231 lbs  BMI 33.71 33.65 34.1  Systolic 120 136 161  Diastolic 64 68 74  Pulse 80 72 72    Orthostatic VS for the past  72 hrs (Last 3 readings):  Patient Position BP Location Cuff Size  03/05/23 0845 Sitting Left Arm Large     Physical Exam Vitals reviewed.  Constitutional:      General: She is not in acute distress.    Appearance: Normal appearance. She is not ill-appearing.  HENT:     Right Ear: Hearing, tympanic membrane, ear canal and external ear normal.     Left Ear: Hearing normal. Tympanic membrane is erythematous.  Eyes:     Conjunctiva/sclera: Conjunctivae normal.  Cardiovascular:     Rate and Rhythm: Normal rate and regular rhythm.     Heart sounds: Normal heart sounds. No murmur heard. Pulmonary:     Effort: Pulmonary effort is normal.     Breath sounds: Normal breath sounds. No wheezing.  Abdominal:     General: Bowel sounds are normal.     Palpations: Abdomen is soft.     Tenderness: There is no abdominal tenderness.  Musculoskeletal:        General: Normal range of motion.  Skin:    General: Skin is warm.  Neurological:     Mental Status: She is alert. Mental status is at baseline.  Psychiatric:        Mood and Affect: Mood normal.        Behavior: Behavior normal.     Health Maintenance Due  Topic Date Due   Pneumococcal Vaccine 70-67 Years old (2 of 2 - PCV) 08/21/2020   OPHTHALMOLOGY EXAM  05/30/2021    Diabetic kidney evaluation - Urine ACR  03/04/2023   FOOT EXAM  03/04/2023   Lung Cancer Screening  03/16/2023    There are no preventive care reminders to display for this patient.   Lab Results  Component Value Date   TSH 1.040 08/26/2021   Lab Results  Component Value Date   WBC 10.3 10/27/2022   HGB 15.0 10/27/2022   HCT 45.9 10/27/2022   MCV 99 (H) 10/27/2022   PLT 320 10/27/2022   Lab Results  Component Value Date   NA 141 07/07/2022   K 4.6 07/07/2022   CO2 20 07/07/2022   GLUCOSE 107 (H) 07/07/2022   BUN 12 07/07/2022   CREATININE 0.61 07/07/2022   BILITOT 0.7 07/07/2022   ALKPHOS 113 07/07/2022   AST 20 07/07/2022   ALT 27 07/07/2022   PROT 6.9 07/07/2022   ALBUMIN 4.5 07/07/2022   CALCIUM 10.1 07/07/2022   EGFR 102 07/07/2022   Lab Results  Component Value Date   CHOL 158 07/07/2022   Lab Results  Component Value Date   HDL 76 07/07/2022   Lab Results  Component Value Date   LDLCALC 65 07/07/2022   Lab Results  Component Value Date   TRIG 91 07/07/2022   Lab Results  Component Value Date   CHOLHDL 2.1 07/07/2022   Lab Results  Component Value Date   HGBA1C 5.7 (H) 10/27/2022       Assessment & Plan:  Benign positional vertigo, bilateral Assessment & Plan: Acute Recurrent episodes of vertigo, worse with head movement. No recent changes in medication or illness. Possible association with sinus congestion. No recent head injury. - Prescribe Meclizine up to three times a day as needed for dizziness. - Urgent referral to ENT for evaluation and possible Epley maneuver.  Orders: -     Meclizine HCl; Take 1 tablet (25 mg total) by mouth 3 (three) times daily as needed for dizziness.  Dispense: 30 tablet; Refill: 0 -  Ambulatory referral to ENT -     CBC with Differential/Platelet  Non-recurrent acute suppurative otitis media of left ear without spontaneous rupture of tympanic membrane Assessment & Plan: Acute Left ear appears red on  examination, no pain or drainage reported. - Prescribe antibiotic ear drops for left ear, 5 drops once daily for 7 days  Orders: -     Ofloxacin; Place 5 drops into the left ear daily for 7 days.  Dispense: 5 mL; Refill: 0  Visit for screening mammogram -     3D Screening Mammogram, Left and Right; Future     Meds ordered this encounter  Medications   meclizine (ANTIVERT) 25 MG tablet    Sig: Take 1 tablet (25 mg total) by mouth 3 (three) times daily as needed for dizziness.    Dispense:  30 tablet    Refill:  0   ofloxacin (FLOXIN) 0.3 % OTIC solution    Sig: Place 5 drops into the left ear daily for 7 days.    Dispense:  5 mL    Refill:  0    Orders Placed This Encounter  Procedures   MM 3D SCREENING MAMMOGRAM BILATERAL BREAST   CBC with Differential   Ambulatory referral to ENT     Follow-up: Return if symptoms worsen or fail to improve.  An After Visit Summary was printed and given to the patient.  Total time spent on today's visit was 35 minutes, including both face-to-face time and nonface-to-face time personally spent on review of chart (labs and imaging), discussing labs and goals, discussing further work-up, treatment options, referrals to specialist if needed, reviewing outside records if pertinent, answering patient's questions, and coordinating care.   Lajuana Matte, FNP [facility] [phone]

## 2023-03-05 NOTE — Assessment & Plan Note (Signed)
 Acute Left ear appears red on examination, no pain or drainage reported. - Prescribe antibiotic ear drops for left ear, 5 drops once daily for 7 days

## 2023-03-08 ENCOUNTER — Telehealth: Payer: Self-pay

## 2023-03-08 NOTE — Telephone Encounter (Signed)
 Copied from CRM [id number]. Topic: Referral - Question >> Mar 05, 2023  2:00 PM Dennison Nancy wrote: Reason for CRM: Patient had an appointment today 03/05/23 with Lajuana Matte and was referred to  [facility] health for ENT referral, [facility] called patient today to schedule patient an appointment , however the issue is the  first available is not until May , patient want to know is there somewhere else she can go to get a sooner appointment Please contact patient to let know status [phone]

## 2023-03-10 ENCOUNTER — Telehealth: Payer: Self-pay

## 2023-03-10 NOTE — Telephone Encounter (Signed)
 Copied from CRM [id number]. Topic: General - Other >> Mar 10, 2023 12:08 PM Antwanette L wrote: Reason for CRM: Patient is not feeling well and she wants to know if Dr. Sedalia Muta can write her a work excuse from 3/5 to 3/12 until she see's her otolaryngologist on 3/11 at [facility]. Patient said she can  come the office and pick up the excuse. Patient can be contacted at [phone].

## 2023-03-11 ENCOUNTER — Encounter: Payer: Self-pay | Admitting: Family Medicine

## 2023-03-11 NOTE — Telephone Encounter (Signed)
 Done by nurse. Dr. Sedalia Muta

## 2023-03-17 ENCOUNTER — Ambulatory Visit (INDEPENDENT_AMBULATORY_CARE_PROVIDER_SITE_OTHER): Admitting: Family Medicine

## 2023-03-17 ENCOUNTER — Encounter: Payer: Self-pay | Admitting: Family Medicine

## 2023-03-17 VITALS — BP 104/62 | HR 84 | Temp 98.9°F | Resp 16 | Ht 69.0 in | Wt 228.0 lb

## 2023-03-17 DIAGNOSIS — H8113 Benign paroxysmal vertigo, bilateral: Secondary | ICD-10-CM | POA: Diagnosis not present

## 2023-03-17 DIAGNOSIS — G4733 Obstructive sleep apnea (adult) (pediatric): Secondary | ICD-10-CM | POA: Diagnosis not present

## 2023-03-17 NOTE — Assessment & Plan Note (Signed)
 Chronic Severe obstructive sleep apnea managed with CPAP therapy for five years, resulting in significant symptom improvement. She is compliant with treatment and wears it every night.   -Do not smoke, vape, or use products with nicotine or tobacco in them.  -If you were given a PAP device to open your airway while you sleep, use it as indicated -If you're having surgery, make sure to tell your provider you have sleep apnea. You may need to bring your PAP device with you.  Please call the office if: -The PAP device that you were given to use during sleep bothers you or does not seem to be working. -You do not feel better or you feel worse.

## 2023-03-17 NOTE — Progress Notes (Signed)
 Subjective:  Patient ID: Kathleen Wells, female    DOB: 02-06-1960  Age: 63 y.o. MRN: [id number]  Chief Complaint  Patient presents with   Medical Management of Chronic Issues    CPAP   Patient presents today for discussion about insurance and CPAP.   Discussed the use of AI scribe software for clinical note transcription with the patient, who gave verbal consent to proceed.  HPI The patient, with obstructive sleep apnea, presents for CPAP documentation and recertification.  She has been using a CPAP machine daily for five years after being diagnosed with sleep apnea. She cannot sleep without it, and it significantly improves her sleep quality. Initially, she experienced severe sleep apnea with frequent issues every hour, placing her in the 'yellow zone'. She no longer falls asleep during the day since using the CPAP machine.  Recently, she encountered issues with her CPAP supply due to a debacle between her home health provider and the supply company, [employer]. She began contacting them in November for supplies and only received them last week. She mentions a family history of sleep apnea, noting that her father, nephews, and possibly her sister have similar issues.  She suspects this issue may be related to new insurance requirements.  She discusses a recent episode of vertigo, which was addressed with Epley maneuvers by an ENT specialist. She reports improvement in her symptoms following the treatment.  She mentions a previous prescription of meclizine, which did not help her and reportedly worsened her condition.     03/05/2023    8:52 AM 10/27/2022    8:17 AM 07/07/2022    8:01 AM 03/03/2022    8:03 AM 08/26/2021    8:04 AM  Depression screen PHQ 2/9  Decreased Interest 0 0 0 0 0  Down, Depressed, Hopeless 1 0 1 0 0  PHQ - 2 Score 1 0 1 0 0  Altered sleeping 1 0 1    Tired, decreased energy 0 0 0    Change in appetite 0 0 0    Feeling bad or failure about yourself  0 0 0     Trouble concentrating 0 0 0    Moving slowly or fidgety/restless 0 0 0    Suicidal thoughts 0 0 0    PHQ-9 Score 2 0 2    Difficult doing work/chores Somewhat difficult Not difficult at all Not difficult at all          03/05/2023    8:52 AM  Fall Risk   Falls in the past year? 0  Number falls in past yr: 0  Injury with Fall? 0  Risk for fall due to : No Fall Risks  Follow up Falls evaluation completed    Patient Care Team: Blane Ohara, MD as PCP - General (Family Medicine)   Review of Systems  Constitutional:  Negative for chills, diaphoresis, fatigue and fever.  HENT:  Negative for congestion, ear pain and sinus pain.   Respiratory:  Negative for cough and shortness of breath.   Cardiovascular:  Negative for chest pain.  Gastrointestinal:  Negative for abdominal pain, constipation, nausea and vomiting.  Genitourinary:  Negative for dysuria.  Musculoskeletal:  Negative for arthralgias.  Neurological:  Negative for weakness and headaches.  Psychiatric/Behavioral:  Negative for dysphoric mood. The patient is not nervous/anxious.      Current Outpatient Medications on File Prior to Visit  Medication Sig Dispense Refill   ACCU-CHEK GUIDE test strip USE TO TEST UP TO 4 TIMES  DAILY AS DIRECTED 100 strip 2   albuterol (VENTOLIN HFA) 108 (90 Base) MCG/ACT inhaler TAKE 2 PUFFS BY MOUTH EVERY 6 HOURS AS NEEDED FOR WHEEZE OR SHORTNESS OF BREATH 8.5 each 2   aspirin 81 MG chewable tablet Chew 81 mg by mouth daily.     atorvastatin (LIPITOR) 40 MG tablet TAKE 1 TABLET BY MOUTH EVERY DAY 90 tablet 1   BREZTRI AEROSPHERE 160-9-4.8 MCG/ACT AERO INHALE 2 PUFFS INTO THE LUNGS IN THE MORNING AND AT BEDTIME. 10.7 each 11   fluticasone (FLONASE) 50 MCG/ACT nasal spray Place 2 sprays into both nostrils daily. 16 g 6   olmesartan (BENICAR) 20 MG tablet TAKE 1 TABLET BY MOUTH EVERY DAY 90 tablet 1   No current facility-administered medications on file prior to visit.   Past Medical History:   Diagnosis Date   Essential (primary) hypertension    Mixed hyperlipidemia    Obstructive sleep apnea (adult) (pediatric)    Type 2 diabetes mellitus with other specified complication (HCC)    No past surgical history on file.  Family History  Problem Relation Age of Onset   Lung cancer Mother    Congestive Heart Failure Father    Lupus Sister    Stomach cancer Maternal Grandmother    Hepatitis C Brother    Breast cancer Neg Hx    Social History   Socioeconomic History   Marital status: Media planner    Spouse name: Not on file   Number of children: 0   Years of education: Not on file   Highest education level: Not on file  Occupational History   Occupation: OFFICE  Tobacco Use   Smoking status: Every Day    Current packs/day: 0.50    Average packs/day: 0.5 packs/day for 42.0 years (21.0 ttl pk-yrs)    Types: Cigarettes   Smokeless tobacco: Never  Vaping Use   Vaping status: Never Used  Substance and Sexual Activity   Alcohol use: Yes    Alcohol/week: 8.0 standard drinks of alcohol    Types: 4 Cans of beer, 4 Standard drinks or equivalent per week   Drug use: Never   Sexual activity: Not Currently  Other Topics Concern   Not on file  Social History Narrative   Not on file   Social Drivers of Health   Financial Resource Strain: Low Risk  (03/02/2021)   Overall Financial Resource Strain (CARDIA)    Difficulty of Paying Living Expenses: Not hard at all  Food Insecurity: Low Risk  (03/16/2023)   Received from [facility]   Hunger Vital Sign    Worried About Running Out of Food in the Last Year: Never true    Ran Out of Food in the Last Year: Never true  Transportation Needs: No Transportation Needs (03/16/2023)   Received from [facility]    In the past 12 months, has lack of reliable transportation kept you from medical appointments, meetings, work or from getting things needed for daily living? : No  Physical Activity: Insufficiently  Active (07/07/2022)   Exercise Vital Sign    Days of Exercise per Week: 7 days    Minutes of Exercise per Session: 20 min  Stress: No Stress Concern Present (03/02/2021)   [employer] of Occupational Health - Occupational Stress Questionnaire    Feeling of Stress : Not at all  Social Connections: Moderately Isolated (03/03/2022)   Social Connection and Isolation Panel [NHANES]    Frequency of Communication with Friends and  Family: Three times a week    Frequency of Social Gatherings with Friends and Family: Three times a week    Attends Religious Services: Never    Active Member of Clubs or Organizations: No    Attends Banker Meetings: Never    Marital Status: Living with partner    Objective:  BP 104/62   Pulse 84   Temp 98.9 F (37.2 C) (Oral)   Resp 16   Ht 5\' 9"  (1.753 m)   Wt 228 lb (103.4 kg)   SpO2 95%   BMI 33.67 kg/m      03/17/2023   10:45 AM 03/05/2023    8:45 AM 10/27/2022    8:15 AM  BP/Weight  Systolic BP 104 120 136  Diastolic BP 62 64 68  Wt. (Lbs) 228 228.4 228  BMI 33.67 kg/m2 33.73 kg/m2 33.67 kg/m2    Physical Exam Vitals reviewed.  Constitutional:      General: She is not in acute distress.    Appearance: Normal appearance. She is not ill-appearing.  Eyes:     Conjunctiva/sclera: Conjunctivae normal.  Cardiovascular:     Rate and Rhythm: Normal rate and regular rhythm.     Heart sounds: Normal heart sounds. No murmur heard. Pulmonary:     Effort: Pulmonary effort is normal.     Breath sounds: Normal breath sounds. No wheezing.  Musculoskeletal:        General: Normal range of motion.     Cervical back: Normal range of motion.  Skin:    General: Skin is warm.  Neurological:     Mental Status: She is alert. Mental status is at baseline.  Psychiatric:        Mood and Affect: Mood normal.        Behavior: Behavior normal.      Lab Results  Component Value Date   WBC 10.4 03/05/2023   HGB 14.6 03/05/2023   HCT 44.4  03/05/2023   PLT 347 03/05/2023   GLUCOSE 107 (H) 07/07/2022   CHOL 158 07/07/2022   TRIG 91 07/07/2022   HDL 76 07/07/2022   LDLCALC 65 07/07/2022   ALT 27 07/07/2022   AST 20 07/07/2022   NA 141 07/07/2022   K 4.6 07/07/2022   CL 105 07/07/2022   CREATININE 0.61 07/07/2022   BUN 12 07/07/2022   CO2 20 07/07/2022   TSH 1.040 08/26/2021   HGBA1C 5.7 (H) 10/27/2022   MICROALBUR 10 02/22/2020      Assessment & Plan:   Obstructive sleep apnea (adult) (pediatric) Assessment & Plan: Chronic Severe obstructive sleep apnea managed with CPAP therapy for five years, resulting in significant symptom improvement. She is compliant with treatment and wears it every night.   -Do not smoke, vape, or use products with nicotine or tobacco in them.  -If you were given a PAP device to open your airway while you sleep, use it as indicated -If you're having surgery, make sure to tell your provider you have sleep apnea. You may need to bring your PAP device with you.  Please call the office if: -The PAP device that you were given to use during sleep bothers you or does not seem to be working. -You do not feel better or you feel worse.   Benign positional vertigo, bilateral Assessment & Plan: Sub-acute Recent episodes of vertigo improved after ENT appointment which involved Epley maneuvers.  - Discontinue Meclizine      Follow-up: Return if symptoms worsen or fail  to improve.  Bobbye Charleston, FNP, have reviewed all documentation for this visit. The documentation on 03/17/23 for the exam, diagnosis, procedures, and orders are all accurate and complete.    An After Visit Summary was printed and given to the patient.  Lajuana Matte, FNP [facility] [phone]

## 2023-03-17 NOTE — Assessment & Plan Note (Signed)
 Sub-acute Recent episodes of vertigo improved after ENT appointment which involved Epley maneuvers.  - Discontinue Meclizine

## 2023-03-18 ENCOUNTER — Other Ambulatory Visit: Payer: Self-pay | Admitting: Family Medicine

## 2023-03-18 DIAGNOSIS — J209 Acute bronchitis, unspecified: Secondary | ICD-10-CM

## 2023-03-18 DIAGNOSIS — J410 Simple chronic bronchitis: Secondary | ICD-10-CM

## 2023-03-18 DIAGNOSIS — J0100 Acute maxillary sinusitis, unspecified: Secondary | ICD-10-CM

## 2023-03-18 DIAGNOSIS — R051 Acute cough: Secondary | ICD-10-CM

## 2023-04-09 ENCOUNTER — Other Ambulatory Visit: Payer: Self-pay | Admitting: Family Medicine

## 2023-04-09 DIAGNOSIS — I10 Essential (primary) hypertension: Secondary | ICD-10-CM

## 2023-04-14 ENCOUNTER — Other Ambulatory Visit: Payer: Self-pay | Admitting: Family Medicine

## 2023-04-14 DIAGNOSIS — H66002 Acute suppurative otitis media without spontaneous rupture of ear drum, left ear: Secondary | ICD-10-CM

## 2023-05-02 NOTE — Progress Notes (Signed)
 Subjective:  Patient ID: Kathleen Wells, female    DOB: [birth year]  Age: 63 y.o. MRN: [id number]  Chief Complaint  Patient presents with   Medical Management of Chronic Issues    HPI: DMII: No medications-Diet controlled,  check checks feet daily. Eye appt scheduled in July 2025.   Hyperlipidemia: Lipitor 40 mg daily. ASA 81 mg daily.  Eating healthy (avoids sugar and caffeine) and exercising. Walks a lot at work. Wears good shoes. Back hurts.   HTN: Olmesartan   20 mg daily  COPD: Breztri  and Albuterol . Continues to smoke. 1/2 ppd.  Trying to quit. Decreasing.   Sleep apnea:  wears CPAP every night.  Sleep much improved.     03/05/2023    8:52 AM 10/27/2022    8:17 AM 07/07/2022    8:01 AM 03/03/2022    8:03 AM 08/26/2021    8:04 AM  Depression screen PHQ 2/9  Decreased Interest 0 0 0 0 0  Down, Depressed, Hopeless 1 0 1 0 0  PHQ - 2 Score 1 0 1 0 0  Altered sleeping 1 0 1    Tired, decreased energy 0 0 0    Change in appetite 0 0 0    Feeling bad or failure about yourself  0 0 0    Trouble concentrating 0 0 0    Moving slowly or fidgety/restless 0 0 0    Suicidal thoughts 0 0 0    PHQ-9 Score 2 0 2    Difficult doing work/chores Somewhat difficult Not difficult at all Not difficult at all          03/05/2023    8:52 AM  Fall Risk   Falls in the past year? 0  Number falls in past yr: 0  Injury with Fall? 0  Risk for fall due to : No Fall Risks  Follow up Falls evaluation completed    Patient Care Team: Mercy Stall, MD as PCP - General (Family Medicine)   Review of Systems  Constitutional:  Negative for appetite change, fatigue and fever.  HENT:  Negative for congestion, ear pain, sinus pressure and sore throat.   Respiratory:  Negative for cough, chest tightness, shortness of breath and wheezing.   Cardiovascular:  Negative for chest pain and palpitations.  Gastrointestinal:  Negative for abdominal pain, constipation, diarrhea, nausea and vomiting.   Genitourinary:  Negative for dysuria and hematuria.  Musculoskeletal:  Positive for back pain. Negative for arthralgias, joint swelling and myalgias.  Skin:  Negative for rash.  Neurological:  Negative for dizziness, weakness and headaches.  Psychiatric/Behavioral:  Negative for dysphoric mood. The patient is not nervous/anxious.     Current Outpatient Medications on File Prior to Visit  Medication Sig Dispense Refill   ACCU-CHEK GUIDE test strip USE TO TEST UP TO 4 TIMES DAILY AS DIRECTED 100 strip 2   albuterol  (VENTOLIN  HFA) 108 (90 Base) MCG/ACT inhaler TAKE 2 PUFFS BY MOUTH EVERY 6 HOURS AS NEEDED FOR WHEEZE OR SHORTNESS OF BREATH 8.5 each 2   aspirin 81 MG chewable tablet Chew 81 mg by mouth daily.     atorvastatin  (LIPITOR) 40 MG tablet TAKE 1 TABLET BY MOUTH EVERY DAY 90 tablet 1   BREZTRI  AEROSPHERE 160-9-4.8 MCG/ACT AERO INHALE 2 PUFFS INTO THE LUNGS IN THE MORNING AND AT BEDTIME. 10.7 each 11   fluticasone  (FLONASE ) 50 MCG/ACT nasal spray SPRAY 2 SPRAYS INTO EACH NOSTRIL EVERY DAY 48 mL 2   olmesartan  (BENICAR ) 20 MG tablet TAKE  1 TABLET BY MOUTH EVERY DAY 90 tablet 1   No current facility-administered medications on file prior to visit.   Past Medical History:  Diagnosis Date   Essential (primary) hypertension    Mixed hyperlipidemia    Obstructive sleep apnea (adult) (pediatric)    Type 2 diabetes mellitus with other specified complication (HCC)    History reviewed. No pertinent surgical history.  Family History  Problem Relation Age of Onset   Lung cancer Mother    Congestive Heart Failure Father    Lupus Sister    Stomach cancer Maternal Grandmother    Hepatitis C Brother    Breast cancer Neg Hx    Social History   Socioeconomic History   Marital status: Media planner    Spouse name: Not on file   Number of children: 0   Years of education: Not on file   Highest education level: Not on file  Occupational History   Occupation: OFFICE  Tobacco Use    Smoking status: Every Day    Current packs/day: 0.50    Average packs/day: 0.5 packs/day for 42.0 years (21.0 ttl pk-yrs)    Types: Cigarettes   Smokeless tobacco: Never  Vaping Use   Vaping status: Never Used  Substance and Sexual Activity   Alcohol use: Yes    Alcohol/week: 8.0 standard drinks of alcohol    Types: 4 Cans of beer, 4 Standard drinks or equivalent per week   Drug use: Never   Sexual activity: Not Currently  Other Topics Concern   Not on file  Social History Narrative   Not on file   Social Drivers of Health   Financial Resource Strain: Low Risk  (03/02/2021)   Overall Financial Resource Strain (CARDIA)    Difficulty of Paying Living Expenses: Not hard at all  Food Insecurity: Low Risk  (03/16/2023)   Received from [facility]   Hunger Vital Sign    Worried About Running Out of Food in the Last Year: Never true    Ran Out of Food in the Last Year: Never true  Transportation Needs: No Transportation Needs (03/16/2023)   Received from [facility]    In the past 12 months, has lack of reliable transportation kept you from medical appointments, meetings, work or from getting things needed for daily living? : No  Physical Activity: Insufficiently Active (07/07/2022)   Exercise Vital Sign    Days of Exercise per Week: 7 days    Minutes of Exercise per Session: 20 min  Stress: No Stress Concern Present (03/02/2021)   [employer] of Occupational Health - Occupational Stress Questionnaire    Feeling of Stress : Not at all  Social Connections: Moderately Isolated (03/03/2022)   Social Connection and Isolation Panel [NHANES]    Frequency of Communication with Friends and Family: Three times a week    Frequency of Social Gatherings with Friends and Family: Three times a week    Attends Religious Services: Never    Active Member of Clubs or Organizations: No    Attends Banker Meetings: Never    Marital Status: Living with partner     Objective:  BP 138/72 (BP Location: Left Arm, Patient Position: Sitting)   Pulse 75   Temp 97.9 F (36.6 C) (Temporal)   Ht 5\' 9"  (1.753 m)   Wt 221 lb (100.2 kg)   SpO2 98%   BMI 32.64 kg/m      05/03/2023    9:50 AM 03/17/2023  10:45 AM 03/05/2023    8:45 AM  BP/Weight  Systolic BP 138 104 120  Diastolic BP 72 62 64  Wt. (Lbs) 221 228 228.4  BMI 32.64 kg/m2 33.67 kg/m2 33.73 kg/m2    Physical Exam Vitals reviewed.  Constitutional:      Appearance: Normal appearance. She is obese.  Neck:     Vascular: No carotid bruit.  Cardiovascular:     Rate and Rhythm: Normal rate and regular rhythm.     Heart sounds: Normal heart sounds.  Pulmonary:     Effort: Pulmonary effort is normal. No respiratory distress.     Breath sounds: Normal breath sounds.  Abdominal:     General: Abdomen is flat. Bowel sounds are normal.     Palpations: Abdomen is soft.     Tenderness: There is no abdominal tenderness.  Neurological:     Mental Status: She is alert and oriented to person, place, and time.  Psychiatric:        Mood and Affect: Mood normal.        Behavior: Behavior normal.     Diabetic Foot Exam - Simple   No data filed      Lab Results  Component Value Date   WBC 10.5 05/03/2023   HGB 14.7 05/03/2023   HCT 42.7 05/03/2023   PLT 301 05/03/2023   GLUCOSE 109 (H) 05/03/2023   CHOL 152 05/03/2023   TRIG 73 05/03/2023   HDL 78 05/03/2023   LDLCALC 60 05/03/2023   ALT 25 05/03/2023   AST 22 05/03/2023   NA 141 05/03/2023   K 4.3 05/03/2023   CL 107 (H) 05/03/2023   CREATININE 0.65 05/03/2023   BUN 11 05/03/2023   CO2 19 (L) 05/03/2023   TSH 1.040 08/26/2021   HGBA1C 5.8 (H) 05/03/2023   MICROALBUR 10 02/22/2020      Assessment & Plan:  Hypertension associated with diabetes (HCC) Assessment & Plan: Diabetes and hypertension well controlled.  No changes to medicines. Olmesartan   20 mg daily Continue to work on eating a healthy diet and exercise.   Labs drawn today.  Recommend check feet daily. Recommend annual eye exams.    Orders: -     CBC with Differential/Platelet -     Comprehensive metabolic panel with GFR  Obstructive sleep apnea (adult) (pediatric) Assessment & Plan: Continue cpap.    Simple chronic bronchitis (HCC) Assessment & Plan: Continue breztri  2 puffs twice daily.  Recommend use albuterol  hfa 2 puffs four times a day x 2 days since wheezing a little, 2 puffs four times a day as needed  Recommend quit smoking.   Hyperlipidemia associated with type 2 diabetes mellitus (HCC) Assessment & Plan: Control: cholesterol and diabetes well controlled.  Recommend check sugars fasting daily. Recommend check feet daily. Recommend annual eye exams. Medicines: Lipitor and aspirin.  Continue to work on eating a healthy diet and exercise.  Labs drawn today.     Orders: -     Hemoglobin A1c -     Microalbumin / creatinine urine ratio  History of tobacco abuse Assessment & Plan: Recommend cessation. Recommend slow wean.  Orders: -     CT CHEST LUNG CANCER SCREENING LOW DOSE WO CONTRAST  Mixed hyperlipidemia Assessment & Plan: Well controlled.  No changes to medicines. Lipitor 40 mg daily Continue to work on eating a healthy diet and exercise.  Labs drawn today.   Orders: -     Lipid panel  Need for vaccination -  Pneumococcal conjugate vaccine 20-valent     No orders of the defined types were placed in this encounter.   Orders Placed This Encounter  Procedures   CT CHEST LUNG CANCER SCREENING LOW DOSE WO CONTRAST   Pneumococcal conjugate vaccine 20-valent (Prevnar 20)   CBC with Differential/Platelet   Comprehensive metabolic panel with GFR   Hemoglobin A1c   Lipid panel   Microalbumin / creatinine urine ratio     Follow-up: Return in about 6 months (around 11/02/2023) for cpe.   Gladys Lamp I Leal-Borjas,acting as a scribe for Mercy Stall, MD.,have documented all relevant documentation  on the behalf of Mercy Stall, MD,as directed by  Mercy Stall, MD while in the presence of Mercy Stall, MD.   I attest that I have reviewed this visit and agree with the plan scribed by my staff.  AVS GIVEN.   Mercy Stall, MD [facility] [phone]

## 2023-05-03 ENCOUNTER — Ambulatory Visit: Payer: BC Managed Care – PPO | Admitting: Family Medicine

## 2023-05-03 ENCOUNTER — Encounter: Payer: Self-pay | Admitting: Family Medicine

## 2023-05-03 VITALS — BP 138/72 | HR 75 | Temp 97.9°F | Ht 69.0 in | Wt 221.0 lb

## 2023-05-03 DIAGNOSIS — E1159 Type 2 diabetes mellitus with other circulatory complications: Secondary | ICD-10-CM | POA: Diagnosis not present

## 2023-05-03 DIAGNOSIS — J410 Simple chronic bronchitis: Secondary | ICD-10-CM

## 2023-05-03 DIAGNOSIS — E1169 Type 2 diabetes mellitus with other specified complication: Secondary | ICD-10-CM | POA: Diagnosis not present

## 2023-05-03 DIAGNOSIS — E785 Hyperlipidemia, unspecified: Secondary | ICD-10-CM

## 2023-05-03 DIAGNOSIS — Z87891 Personal history of nicotine dependence: Secondary | ICD-10-CM

## 2023-05-03 DIAGNOSIS — Z23 Encounter for immunization: Secondary | ICD-10-CM | POA: Diagnosis not present

## 2023-05-03 DIAGNOSIS — I152 Hypertension secondary to endocrine disorders: Secondary | ICD-10-CM

## 2023-05-03 DIAGNOSIS — G4733 Obstructive sleep apnea (adult) (pediatric): Secondary | ICD-10-CM | POA: Diagnosis not present

## 2023-05-03 DIAGNOSIS — E782 Mixed hyperlipidemia: Secondary | ICD-10-CM

## 2023-05-03 DIAGNOSIS — F17218 Nicotine dependence, cigarettes, with other nicotine-induced disorders: Secondary | ICD-10-CM

## 2023-05-04 ENCOUNTER — Encounter: Payer: Self-pay | Admitting: Family Medicine

## 2023-05-04 LAB — CBC WITH DIFFERENTIAL/PLATELET
Basophils Absolute: 0.1 10*3/uL (ref 0.0–0.2)
Basos: 1 %
EOS (ABSOLUTE): 0.3 10*3/uL (ref 0.0–0.4)
Eos: 3 %
Hematocrit: 42.7 % (ref 34.0–46.6)
Hemoglobin: 14.7 g/dL (ref 11.1–15.9)
Immature Grans (Abs): 0 10*3/uL (ref 0.0–0.1)
Immature Granulocytes: 0 %
Lymphocytes Absolute: 2.9 10*3/uL (ref 0.7–3.1)
Lymphs: 27 %
MCH: 33 pg (ref 26.6–33.0)
MCHC: 34.4 g/dL (ref 31.5–35.7)
MCV: 96 fL (ref 79–97)
Monocytes Absolute: 0.6 10*3/uL (ref 0.1–0.9)
Monocytes: 6 %
Neutrophils Absolute: 6.6 10*3/uL (ref 1.4–7.0)
Neutrophils: 63 %
Platelets: 301 10*3/uL (ref 150–450)
RBC: 4.46 x10E6/uL (ref 3.77–5.28)
RDW: 12.2 % (ref 11.7–15.4)
WBC: 10.5 10*3/uL (ref 3.4–10.8)

## 2023-05-04 LAB — HEMOGLOBIN A1C
Est. average glucose Bld gHb Est-mCnc: 120 mg/dL
Hgb A1c MFr Bld: 5.8 % — ABNORMAL HIGH (ref 4.8–5.6)

## 2023-05-04 LAB — MICROALBUMIN / CREATININE URINE RATIO
Creatinine, Urine: 94.1 mg/dL
Microalb/Creat Ratio: 6 mg/g{creat} (ref 0–29)
Microalbumin, Urine: 5.8 ug/mL

## 2023-05-04 LAB — COMPREHENSIVE METABOLIC PANEL WITH GFR
ALT: 25 IU/L (ref 0–32)
AST: 22 IU/L (ref 0–40)
Albumin: 4.6 g/dL (ref 3.9–4.9)
Alkaline Phosphatase: 123 IU/L — ABNORMAL HIGH (ref 44–121)
BUN/Creatinine Ratio: 17 (ref 12–28)
BUN: 11 mg/dL (ref 8–27)
Bilirubin Total: 0.7 mg/dL (ref 0.0–1.2)
CO2: 19 mmol/L — ABNORMAL LOW (ref 20–29)
Calcium: 10.3 mg/dL (ref 8.7–10.3)
Chloride: 107 mmol/L — ABNORMAL HIGH (ref 96–106)
Creatinine, Ser: 0.65 mg/dL (ref 0.57–1.00)
Globulin, Total: 2.2 g/dL (ref 1.5–4.5)
Glucose: 109 mg/dL — ABNORMAL HIGH (ref 70–99)
Potassium: 4.3 mmol/L (ref 3.5–5.2)
Sodium: 141 mmol/L (ref 134–144)
Total Protein: 6.8 g/dL (ref 6.0–8.5)
eGFR: 99 mL/min/{1.73_m2} (ref >59)

## 2023-05-04 LAB — LIPID PANEL
Chol/HDL Ratio: 1.9 ratio (ref 0.0–4.4)
Cholesterol, Total: 152 mg/dL (ref 100–199)
HDL: 78 mg/dL (ref >39)
LDL Chol Calc (NIH): 60 mg/dL (ref 0–99)
Triglycerides: 73 mg/dL (ref 0–149)
VLDL Cholesterol Cal: 14 mg/dL (ref 5–40)

## 2023-05-05 NOTE — Assessment & Plan Note (Signed)
Recommend cessation. Recommend slow wean.

## 2023-05-05 NOTE — Assessment & Plan Note (Addendum)
 Diabetes and hypertension well controlled.  No changes to medicines. Olmesartan   20 mg daily Continue to work on eating a healthy diet and exercise.  Labs drawn today.  Recommend check feet daily. Recommend annual eye exams.

## 2023-05-05 NOTE — Assessment & Plan Note (Signed)
Well controlled.  No changes to medicines. Lipitor 40 mg daily  Continue to work on eating a healthy diet and exercise.  Labs drawn today.   

## 2023-05-05 NOTE — Assessment & Plan Note (Signed)
Control: cholesterol and diabetes well controlled.  Recommend check sugars fasting daily. Recommend check feet daily. Recommend annual eye exams. Medicines: Lipitor and aspirin.  Continue to work on eating a healthy diet and exercise.  Labs drawn today.

## 2023-05-05 NOTE — Assessment & Plan Note (Signed)
 Continue cpap.

## 2023-05-05 NOTE — Assessment & Plan Note (Signed)
Continue breztri 2 puffs twice daily.  Recommend use albuterol hfa 2 puffs four times a day x 2 days since wheezing a little, 2 puffs four times a day as needed  Recommend quit smoking.

## 2023-05-14 ENCOUNTER — Ambulatory Visit (HOSPITAL_BASED_OUTPATIENT_CLINIC_OR_DEPARTMENT_OTHER)
Admission: RE | Admit: 2023-05-14 | Discharge: 2023-05-14 | Disposition: A | Discharge status: Home / Self Care | Source: Ambulatory Visit | Attending: Family Medicine | Admitting: Family Medicine

## 2023-05-14 DIAGNOSIS — F17210 Nicotine dependence, cigarettes, uncomplicated: Secondary | ICD-10-CM

## 2023-05-27 ENCOUNTER — Encounter: Payer: Self-pay | Admitting: Family Medicine

## 2023-05-27 ENCOUNTER — Ambulatory Visit: Payer: Self-pay | Admitting: Family Medicine

## 2023-06-01 ENCOUNTER — Other Ambulatory Visit: Payer: Self-pay

## 2023-06-01 ENCOUNTER — Ambulatory Visit
Admission: RE | Admit: 2023-06-01 | Discharge: 2023-06-01 | Disposition: A | Discharge status: Home / Self Care | Payer: 59 | Source: Ambulatory Visit | Attending: Family Medicine | Admitting: Family Medicine

## 2023-06-01 DIAGNOSIS — Z1231 Encounter for screening mammogram for malignant neoplasm of breast: Secondary | ICD-10-CM

## 2023-06-01 DIAGNOSIS — I700 Atherosclerosis of aorta: Secondary | ICD-10-CM

## 2023-06-01 NOTE — Telephone Encounter (Signed)
 Patient came into the office stated she is ok with referral to Cardiology per Dr. Reinhold Carbine recommendation.

## 2023-06-03 ENCOUNTER — Ambulatory Visit: Payer: Self-pay | Admitting: Family Medicine

## 2023-06-10 DIAGNOSIS — J449 Chronic obstructive pulmonary disease, unspecified: Secondary | ICD-10-CM | POA: Insufficient documentation

## 2023-06-10 DIAGNOSIS — I10 Essential (primary) hypertension: Secondary | ICD-10-CM | POA: Insufficient documentation

## 2023-06-10 DIAGNOSIS — E1169 Type 2 diabetes mellitus with other specified complication: Secondary | ICD-10-CM | POA: Insufficient documentation

## 2023-06-15 ENCOUNTER — Ambulatory Visit

## 2023-06-15 VITALS — BP 132/82 | HR 75 | Ht 69.0 in | Wt 227.8 lb

## 2023-06-15 DIAGNOSIS — I152 Hypertension secondary to endocrine disorders: Secondary | ICD-10-CM | POA: Diagnosis not present

## 2023-06-15 DIAGNOSIS — I700 Atherosclerosis of aorta: Secondary | ICD-10-CM | POA: Diagnosis not present

## 2023-06-15 DIAGNOSIS — I2510 Atherosclerotic heart disease of native coronary artery without angina pectoris: Secondary | ICD-10-CM | POA: Insufficient documentation

## 2023-06-15 DIAGNOSIS — R079 Chest pain, unspecified: Secondary | ICD-10-CM

## 2023-06-15 DIAGNOSIS — F17218 Nicotine dependence, cigarettes, with other nicotine-induced disorders: Secondary | ICD-10-CM

## 2023-06-15 DIAGNOSIS — E1159 Type 2 diabetes mellitus with other circulatory complications: Secondary | ICD-10-CM

## 2023-06-15 HISTORY — DX: Atherosclerotic heart disease of native coronary artery without angina pectoris: I25.10

## 2023-06-15 MED ORDER — METOPROLOL TARTRATE 100 MG PO TABS: 100.0000 mg | ORAL_TABLET | Freq: Once | ORAL | 0 refills | Status: DC

## 2023-06-15 NOTE — Patient Instructions (Signed)
 Medication Instructions:  Your physician recommends that you continue on your current medications as directed. Please refer to the Current Medication list given to you today.  *If you need a refill on your cardiac medications before your next appointment, please call your pharmacy*   Lab Work: Your physician recommends that you have a BMP today in the office.  If you have labs (blood work) drawn today and your tests are completely normal, you will receive your results only by: MyChart Message (if you have MyChart) OR A paper copy in the mail If you have any lab test that is abnormal or we need to change your treatment, we will call you to review the results.   Testing/Procedures: Your physician has requested that you have an echocardiogram. Echocardiography is a painless test that uses sound waves to create images of your heart. It provides your doctor with information about the size and shape of your heart and how well your heart's chambers and valves are working. This procedure takes approximately one hour. There are no restrictions for this procedure. Please do NOT wear cologne, perfume, aftershave, or lotions (deodorant is allowed). Please arrive 15 minutes prior to your appointment time.  Please note: We ask at that you not bring children with you during ultrasound (echo/ vascular) testing. Due to room size and safety concerns, children are not allowed in the ultrasound rooms during exams. Our front office staff cannot provide observation of children in our lobby area while testing is being conducted. An adult accompanying a patient to their appointment will only be allowed in the ultrasound room at the discretion of the ultrasound technician under special circumstances. We apologize for any inconvenience.    Your cardiac CT will be scheduled at:   [facility] [address]  [place], [place] [address]  If scheduled at the [facility] at  [address], please enter the parking lot using the [address] entrance and use the FREE valet service at the patient drop-off area. Enter the buidling and check-in with registration on the main floor.  Please follow these instructions carefully (unless otherwise directed):  An IV will be required for this test and Nitroglycerin will be given.   On the Night Before the Test: Be sure to Drink plenty of water. Do not consume any caffeinated/decaffeinated beverages or chocolate 12 hours prior to your test. Do not take any antihistamines 12 hours prior to your test.  On the Day of the Test: Drink plenty of water until 1 hour prior to the test. Do not eat any food 1 hour prior to test. You may take your regular medications prior to the test.  Take metoprolol (Lopressor) 100 mg two hours prior to test.This is a one time dose. If you take Furosemide/Hydrochlorothiazide/Spironolactone/Chlorthalidone, please HOLD on the morning of the test. Patients who wear a continuous glucose monitor MUST remove the device prior to scanning. FEMALES- please wear underwire-free bra if available, avoid dresses & tight clothing       After the Test: Drink plenty of water. After receiving IV contrast, you may experience a mild flushed feeling. This is normal. On occasion, you may experience a mild rash up to 24 hours after the test. This is not dangerous. If this occurs, you can take Benadryl 25 mg, Zyrtec, Claritin, or Allegra and increase your fluid intake. (Patients taking Tikosyn should avoid Benadryl, and may take Zyrtec, Claritin, or Allegra) If you experience trouble breathing, this can be serious.  If it is severe call 911 IMMEDIATELY. If it is mild, please call our office.  We will call to schedule your test 2-4 weeks out understanding that some insurance companies will need an authorization prior to the service being performed.   For more information and frequently asked questions, please visit  our website : [email or web address]  For non-scheduling related questions, please contact the cardiac imaging nurse navigator should you have any questions/concerns: Cardiac Imaging Nurse Navigators Direct Office Dial: [phone]   For scheduling needs, including cancellations and rescheduling, please call [place], [phone].   Follow-Up: At [facility], you and your health needs are our priority.  As part of our continuing mission to provide you with exceptional heart care, we have created designated Provider Care Teams.  These Care Teams include your primary Cardiologist (physician) and Advanced Practice Providers (APPs -  Physician Assistants and Nurse Practitioners) who all work together to provide you with the care you need, when you need it.  We recommend signing up for the patient portal called "MyChart".  Sign up information is provided on this After Visit Summary.  MyChart is used to connect with patients for Virtual Visits (Telemedicine).  Patients are able to view lab/test results, encounter notes, upcoming appointments, etc.  Non-urgent messages can be sent to your provider as well.   To learn more about what you can do with MyChart, go to [email or web address].    Your next appointment:   3 month(s)  The format for your next appointment:   In Person  Provider:   Hillis Lu, MD   Other Instructions Echocardiogram An echocardiogram is a test that uses sound waves (ultrasound) to produce images of the heart. Images from an echocardiogram can provide important information about: Heart size and shape. The size and thickness and movement of your heart's walls. Heart muscle function and strength. Heart valve function or if you have stenosis. Stenosis is when the heart valves are too narrow. If blood is flowing backward through the heart valves (regurgitation). A tumor or infectious growth around the heart valves. Areas of heart muscle that are not  working well because of poor blood flow or injury from a heart attack. Aneurysm detection. An aneurysm is a weak or damaged part of an artery wall. The wall bulges out from the normal force of blood pumping through the body. Tell a health care provider about: Any allergies you have. All medicines you are taking, including vitamins, herbs, eye drops, creams, and over-the-counter medicines. Any blood disorders you have. Any surgeries you have had. Any medical conditions you have. Whether you are pregnant or may be pregnant. What are the risks? Generally, this is a safe test. However, problems may occur, including an allergic reaction to dye (contrast) that may be used during the test. What happens before the test? No specific preparation is needed. You may eat and drink normally. What happens during the test? You will take off your clothes from the waist up and put on a hospital gown. Electrodes or electrocardiogram (ECG)patches may be placed on your chest. The electrodes or patches are then connected to a device that monitors your heart rate and rhythm. You will lie down on a table for an ultrasound exam. A gel will be applied to your chest to help sound waves pass through your skin. A handheld device, called a transducer, will be pressed against your chest and moved over your heart. The transducer produces sound waves that travel to your heart and bounce back (or "echo"  back) to the transducer. These sound waves will be captured in real-time and changed into images of your heart that can be viewed on a video monitor. The images will be recorded on a computer and reviewed by your health care provider. You may be asked to change positions or hold your breath for a short time. This makes it easier to get different views or better views of your heart. In some cases, you may receive contrast through an IV in one of your veins. This can improve the quality of the pictures from your heart. The procedure  may vary among health care providers and hospitals.   What can I expect after the test? You may return to your normal, everyday life, including diet, activities, and medicines, unless your health care provider tells you not to do that. Follow these instructions at home: It is up to you to get the results of your test. Ask your health care provider, or the department that is doing the test, when your results will be ready. Keep all follow-up visits. This is important. Summary An echocardiogram is a test that uses sound waves (ultrasound) to produce images of the heart. Images from an echocardiogram can provide important information about the size and shape of your heart, heart muscle function, heart valve function, and other possible heart problems. You do not need to do anything to prepare before this test. You may eat and drink normally. After the echocardiogram is completed, you may return to your normal, everyday life, unless your health care provider tells you not to do that. This information is not intended to replace advice given to you by your health care provider. Make sure you discuss any questions you have with your health care provider. Document Revised: 08/15/2019 Document Reviewed: 08/15/2019 Elsevier Patient Education  2021 Elsevier Inc.   Important Information About Sugar

## 2023-06-15 NOTE — Assessment & Plan Note (Signed)
 Management as reviewed under coronary atherosclerosis above.

## 2023-06-15 NOTE — Progress Notes (Signed)
 Cardiology Consultation:    Date:  06/15/2023   ID:  Kathleen Wells, DOB [birth year], MRN [id number]  PCP:  Kathleen Stall, MD  Cardiologist:  Kathleen Evans Jaedan Schuman, MD   Referring MD: Kathleen Stall, MD   No chief complaint on file.    ASSESSMENT AND PLAN:   [name] 63 year old woman with history of coronary and aortic atherosclerosis on recent lung cancer screening CT study from May 2025, diabetes mellitus [diagnosed in her 6s, diet controlled and recent hemoglobin A1c 5.8], dyslipidemia, hypertension, COPD, OSA-uses CPAP, BPPV, smoking half pack of cigarettes a day, bilateral lower extremity varicose veins [conservatively managing with compression socks], occasional alcohol use. Denies any prior history of MI, CVA, CHF. No prior stress test or echocardiogram Reports family history of father with heart disease in his 67s. Now here for further evaluation of coronary artery disease and reports symptoms of atypical chest discomfort.  Problem List Items Addressed This Visit     Hypertension associated with diabetes (HCC) - Primary   Well-controlled. Target below 130/80 mmHg. - Continue olmesartan  20 mg once daily.       Relevant Medications   metoprolol tartrate (LOPRESSOR) 100 MG tablet   Other Relevant Orders   EKG 12-Lead (Completed)   Basic metabolic panel with GFR   Nicotine dependence   Continues to smoke cigarettes half pack a day. She is motivated to quit.  Reviewed harmful effects. Reviewed options for quitting. She wants to work on this by herself at this time. May benefit from nicotine patches, gum or pharmacotherapy.  Will defer to PCP at this time.       Aortic atherosclerosis (HCC)   Management as reviewed under coronary atherosclerosis above.      Relevant Medications   metoprolol tartrate (LOPRESSOR) 100 MG tablet   Coronary atherosclerosis   Coronary and aortic atherosclerosis on low-dose lung cancer screening CT from May 2025. Has significant  cardiovascular risk factors in the form of diabetes, hypertension, hyperlipidemia, smoker, family history.  Now with atypical symptoms of chest discomfort.  Discussed further evaluation for any obstructive CAD/ischemia with stress test versus cardiac CT with coronary angiogram.  Given her overall risk factors and coronary atherosclerosis noted, we will proceed with cardiac CT coronary angiogram for further risk assessment.  - Continue aspirin 81 mg once daily - Continue atorvastatin  40 mg once daily  Will also obtain transthoracic echocardiogram for cardiac structure and function assessment.      Relevant Medications   metoprolol tartrate (LOPRESSOR) 100 MG tablet   Other Relevant Orders   CT CORONARY MORPH W/CTA COR W/SCORE W/CA W/CM &/OR WO/CM   ECHOCARDIOGRAM COMPLETE   Other Visit Diagnoses       Chest pain, unspecified type       Relevant Orders   CT CORONARY MORPH W/CTA COR W/SCORE W/CA W/CM &/OR WO/CM   ECHOCARDIOGRAM COMPLETE      Return to clinic tentatively in 3 months or based on test results   History of Present Illness:    Kathleen Wells is a 63 y.o. female who is being seen today for the evaluation of coronary and aortic atherosclerosis noted on low-dose CT chest at the request of Cox, Kirsten, MD.  Pleasant woman here for the visit by herself.  Lives with her significant other at home.  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  Walks quite a lot at her job.  No other significant exercise  Has history of coronary and aortic atherosclerosis on recent lung  cancer screening CT study from May 2025, diabetes mellitus [diagnosed in her 71s, diet controlled and recent hemoglobin A1c 5.8], dyslipidemia, hypertension, COPD, OSA-uses CPAP, BPPV, smoking half pack of cigarettes a day, bilateral lower extremity varicose veins [conservatively managing with compression socks], occasional alcohol use. Denies any prior history of MI, CVA, CHF. No prior stress test or  echocardiogram Reports family history of father with heart disease in his 65s.  Mentions overall she has been doing well but notices at times symptoms of atypical chest discomfort while walking and bilateral shoulder discomfort going down the arms.  No significant shortness of breath.  No orthopnea or paroxysmal nocturnal dyspnea.  Denies any syncopal episodes.  EKG in the clinic today shows sinus rhythm heart rate 75/min, PR interval 150 ms, QRS duration 80 ms, QTc 431 ms, normal axis and no significant ischemic changes.  Recent low-dose CT chest for lung cancer screening noted coronary artery calcifications and aortic atherosclerosis  Lipid panel from 05/03/2023 HDL 78, LDL 60, total cholesterol 161, triglycerides 73. Hemoglobin A1c 5.8, well-controlled diabetes BUN 11, creatinine 0.65, EGFR 99 Normal transaminases, mildly elevated alkaline phosphatase 123 CBC unremarkable  Past Medical History:  Diagnosis Date   Aortic atherosclerosis (HCC)    Benign positional vertigo, bilateral 03/05/2023   Class 1 obesity with serious comorbidity and body mass index (BMI) of 33.0 to 33.9 in adult 10/27/2022   COPD (chronic obstructive pulmonary disease) (HCC)    Essential (primary) hypertension    Hyperlipidemia associated with type 2 diabetes mellitus (HCC) 07/07/2022   Hypertension associated with diabetes (HCC) 02/17/2019   Mixed hyperlipidemia    Nicotine dependence 11/22/2019   Non-recurrent acute suppurative otitis media of left ear without spontaneous rupture of tympanic membrane 03/05/2023   Obstructive sleep apnea (adult) (pediatric)    Simple chronic bronchitis (HCC) 03/02/2021   Type 2 diabetes mellitus with other specified complication (HCC)    Vertigo 03/05/2023    Past Surgical History:  Procedure Laterality Date   NO PAST SURGERIES      Current Medications: Current Meds  Medication Sig   ACCU-CHEK GUIDE test strip USE TO TEST UP TO 4 TIMES DAILY AS DIRECTED   albuterol   (VENTOLIN  HFA) 108 (90 Base) MCG/ACT inhaler TAKE 2 PUFFS BY MOUTH EVERY 6 HOURS AS NEEDED FOR WHEEZE OR SHORTNESS OF BREATH   aspirin 81 MG chewable tablet Chew 81 mg by mouth daily.   atorvastatin  (LIPITOR) 40 MG tablet TAKE 1 TABLET BY MOUTH EVERY DAY   BREZTRI  AEROSPHERE 160-9-4.8 MCG/ACT AERO INHALE 2 PUFFS INTO THE LUNGS IN THE MORNING AND AT BEDTIME.   fluticasone  (FLONASE ) 50 MCG/ACT nasal spray SPRAY 2 SPRAYS INTO EACH NOSTRIL EVERY DAY   metoprolol tartrate (LOPRESSOR) 100 MG tablet Take 1 tablet (100 mg total) by mouth once for 1 dose. Take 2 hours prior to your CT if your heart rate is greater than 55   olmesartan  (BENICAR ) 20 MG tablet TAKE 1 TABLET BY MOUTH EVERY DAY     Allergies:   Lisinopril, Meclizine , Septra [sulfamethoxazole-trimethoprim], and Sulfa antibiotics   Social History   Socioeconomic History   Marital status: Media planner    Spouse name: Not on file   Number of children: 0   Years of education: Not on file   Highest education level: Not on file  Occupational History   Occupation: OFFICE  Tobacco Use   Smoking status: Every Day    Current packs/day: 0.50    Average packs/day: 0.5 packs/day for 42.0 years (  21.0 ttl pk-yrs)    Types: Cigarettes   Smokeless tobacco: Never  Vaping Use   Vaping status: Never Used  Substance and Sexual Activity   Alcohol use: Yes    Alcohol/week: 8.0 standard drinks of alcohol    Types: 4 Cans of beer, 4 Standard drinks or equivalent per week   Drug use: Never   Sexual activity: Not Currently  Other Topics Concern   Not on file  Social History Narrative   Not on file   Social Drivers of Health   Financial Resource Strain: Low Risk  (03/02/2021)   Overall Financial Resource Strain (CARDIA)    Difficulty of Paying Living Expenses: Not hard at all  Food Insecurity: Low Risk  (03/16/2023)   Received from [facility]   Hunger Vital Sign    Worried About Running Out of Food in the Last Year: Never true    Ran Out  of Food in the Last Year: Never true  Transportation Needs: No Transportation Needs (03/16/2023)   Received from [facility]    In the past 12 months, has lack of reliable transportation kept you from medical appointments, meetings, work or from getting things needed for daily living? : No  Physical Activity: Insufficiently Active (07/07/2022)   Exercise Vital Sign    Days of Exercise per Week: 7 days    Minutes of Exercise per Session: 20 min  Stress: No Stress Concern Present (03/02/2021)   [employer] of Occupational Health - Occupational Stress Questionnaire    Feeling of Stress : Not at all  Social Connections: Moderately Isolated (03/03/2022)   Social Connection and Isolation Panel [NHANES]    Frequency of Communication with Friends and Family: Three times a week    Frequency of Social Gatherings with Friends and Family: Three times a week    Attends Religious Services: Never    Active Member of Clubs or Organizations: No    Attends Banker Meetings: Never    Marital Status: Living with partner     Family History: The patient's family history includes Congestive Heart Failure in her father; Hepatitis C in her brother; Lung cancer in her mother; Lupus in her sister; Stomach cancer in her maternal grandmother. There is no history of Breast cancer. ROS:   Please see the history of present illness.    All 14 point review of systems negative except as described per history of present illness.  EKGs/Labs/Other Studies Reviewed:    The following studies were reviewed today:   EKG:  EKG Interpretation Date/Time:  Tuesday June 15 2023 10:21:27 EDT Ventricular Rate:  75 PR Interval:  150 QRS Duration:  80 QT Interval:  386 QTC Calculation: 431 R Axis:   70  Text Interpretation: Normal sinus rhythm Normal ECG No previous ECGs available Confirmed by Bertha Broad reddy [phone]) on 06/15/2023 10:48:35 AM    Recent Labs: 05/03/2023: ALT 25; BUN  11; Creatinine, Ser 0.65; Hemoglobin 14.7; Platelets 301; Potassium 4.3; Sodium 141  Recent Lipid Panel    Component Value Date/Time   CHOL 152 05/03/2023 1021   TRIG 73 05/03/2023 1021   HDL 78 05/03/2023 1021   CHOLHDL 1.9 05/03/2023 1021   LDLCALC 60 05/03/2023 1021    Physical Exam:    VS:  BP 132/82   Pulse 75   Ht 5\' 9"  (1.753 m)   Wt 227 lb 12.8 oz (103.3 kg)   SpO2 95%   BMI 33.64 kg/m  Wt Readings from Last 3 Encounters:  06/15/23 227 lb 12.8 oz (103.3 kg)  05/03/23 221 lb (100.2 kg)  03/17/23 228 lb (103.4 kg)     GENERAL:  Well nourished, well developed in no acute distress NECK: No JVD; No carotid bruits CARDIAC: RRR, S1 and S2 present, no murmurs, no rubs, no gallops CHEST:  Clear to auscultation without rales, wheezing or rhonchi  Extremities: No pitting pedal edema. Pulses bilaterally symmetric with radial 2+ and dorsalis pedis 2+ NEUROLOGIC:  Alert and oriented x 3  Medication Adjustments/Labs and Tests Ordered: Current medicines are reviewed at length with the patient today.  Concerns regarding medicines are outlined above.  Orders Placed This Encounter  Procedures   CT CORONARY MORPH W/CTA COR W/SCORE W/CA W/CM &/OR WO/CM   Basic metabolic panel with GFR   EKG [id number]   ECHOCARDIOGRAM COMPLETE   Meds ordered this encounter  Medications   metoprolol tartrate (LOPRESSOR) 100 MG tablet    Sig: Take 1 tablet (100 mg total) by mouth once for 1 dose. Take 2 hours prior to your CT if your heart rate is greater than 55    Dispense:  1 tablet    Refill:  0    Signed, Kenzly Rogoff reddy Nickolette Espinola, MD, MPH, [facility]. 06/15/2023 11:06 AM    [facility]

## 2023-06-15 NOTE — Assessment & Plan Note (Signed)
 Continues to smoke cigarettes half pack a day. She is motivated to quit.  Reviewed harmful effects. Reviewed options for quitting. She wants to work on this by herself at this time. May benefit from nicotine patches, gum or pharmacotherapy.  Will defer to PCP at this time.

## 2023-06-15 NOTE — Assessment & Plan Note (Signed)
 Recent lipid panel from 05/03/2023 LDL 60, total cholesterol 161, triglycerides 73, HDL 78. Continue with atorvastatin  40 mg once daily.

## 2023-06-15 NOTE — Assessment & Plan Note (Signed)
 Well-controlled. Target below 130/80 mmHg. - Continue olmesartan  20 mg once daily.

## 2023-06-15 NOTE — Assessment & Plan Note (Signed)
 Coronary and aortic atherosclerosis on low-dose lung cancer screening CT from May 2025. Has significant cardiovascular risk factors in the form of diabetes, hypertension, hyperlipidemia, smoker, family history.  Now with atypical symptoms of chest discomfort.  Discussed further evaluation for any obstructive CAD/ischemia with stress test versus cardiac CT with coronary angiogram.  Given her overall risk factors and coronary atherosclerosis noted, we will proceed with cardiac CT coronary angiogram for further risk assessment.  - Continue aspirin 81 mg once daily - Continue atorvastatin  40 mg once daily  Will also obtain transthoracic echocardiogram for cardiac structure and function assessment.

## 2023-06-16 LAB — BASIC METABOLIC PANEL WITH GFR
BUN/Creatinine Ratio: 26 (ref 12–28)
BUN: 15 mg/dL (ref 8–27)
CO2: 18 mmol/L — ABNORMAL LOW (ref 20–29)
Calcium: 9.7 mg/dL (ref 8.7–10.3)
Chloride: 106 mmol/L (ref 96–106)
Creatinine, Ser: 0.58 mg/dL (ref 0.57–1.00)
Glucose: 109 mg/dL — ABNORMAL HIGH (ref 70–99)
Potassium: 4.5 mmol/L (ref 3.5–5.2)
Sodium: 140 mmol/L (ref 134–144)
eGFR: 102 mL/min/{1.73_m2} (ref >59)

## 2023-06-18 ENCOUNTER — Ambulatory Visit: Payer: Self-pay

## 2023-06-18 ENCOUNTER — Ambulatory Visit (HOSPITAL_COMMUNITY)

## 2023-07-30 ENCOUNTER — Ambulatory Visit (HOSPITAL_COMMUNITY)
Admission: RE | Admit: 2023-07-30 | Discharge: 2023-07-30 | Disposition: A | Discharge status: Home / Self Care | Source: Ambulatory Visit | Attending: Cardiology | Admitting: Cardiology

## 2023-07-30 DIAGNOSIS — I5030 Unspecified diastolic (congestive) heart failure: Secondary | ICD-10-CM | POA: Diagnosis not present

## 2023-07-30 DIAGNOSIS — I2510 Atherosclerotic heart disease of native coronary artery without angina pectoris: Secondary | ICD-10-CM | POA: Diagnosis present

## 2023-07-30 DIAGNOSIS — R079 Chest pain, unspecified: Secondary | ICD-10-CM | POA: Insufficient documentation

## 2023-07-30 LAB — ECHOCARDIOGRAM COMPLETE
Area-P 1/2: 3.63 cm2
S' Lateral: 2.7 cm

## 2023-08-15 ENCOUNTER — Other Ambulatory Visit: Payer: Self-pay | Admitting: Family Medicine

## 2023-08-15 DIAGNOSIS — J441 Chronic obstructive pulmonary disease with (acute) exacerbation: Secondary | ICD-10-CM

## 2023-08-16 LAB — HM DIABETES EYE EXAM

## 2023-08-23 ENCOUNTER — Encounter: Payer: Self-pay | Admitting: Family Medicine

## 2023-08-30 ENCOUNTER — Other Ambulatory Visit: Payer: Self-pay | Admitting: Family Medicine

## 2023-08-30 DIAGNOSIS — E1169 Type 2 diabetes mellitus with other specified complication: Secondary | ICD-10-CM

## 2023-09-10 ENCOUNTER — Other Ambulatory Visit: Payer: Self-pay

## 2023-09-13 ENCOUNTER — Ambulatory Visit

## 2023-10-09 ENCOUNTER — Other Ambulatory Visit: Payer: Self-pay

## 2023-10-09 DIAGNOSIS — I10 Essential (primary) hypertension: Secondary | ICD-10-CM

## 2023-10-25 ENCOUNTER — Ambulatory Visit: Payer: Self-pay

## 2023-10-25 NOTE — Telephone Encounter (Signed)
 FYI Only or Action Required?: FYI only for provider.  Patient was last seen in primary care on 05/03/2023 by Sherre Clapper, MD.  Called Nurse Triage reporting Pain.  Symptoms began x couple of weeks.  Interventions attempted: OTC medications: Advil.  Symptoms are: gradually worsening.  Triage Disposition: See PCP When Office is Open (Within 3 Days)  Patient/caregiver understands and will follow disposition?: Yes     Copied from CRM #[id number]. Topic: Clinical - Red Word Triage >> Oct 25, 2023 11:08 AM Antwanette L wrote: Red Word that prompted transfer to Nurse Triage: Pt is having pain in both thighs. Pt  is having a hard time walking. Reason for Disposition  [1] MODERATE pain (e.g., interferes with normal activities, limping) AND [2] present > 3 days  Answer Assessment - Initial Assessment Questions 1. ONSET: When did the pain start?      X2 weeks 2. LOCATION: Where is the pain located?      Bilateral thigh 3. PAIN: How bad is the pain?    (Scale 1-10; or mild, moderate, severe)     T worst 10 to 11/10 4. WORK OR EXERCISE: Has there been any recent work or exercise that involved this part of the body?      no 5. CAUSE: What do you think is causing the leg pain?     unsure 6. OTHER SYMPTOMS: Do you have any other symptoms? (e.g., chest pain, back pain, breathing difficulty, swelling, rash, fever, numbness, weakness)     Stabbing pain, back pain 5/10 7. PREGNANCY: Is there any chance you are pregnant? When was your last menstrual period?     na  Movement increases pain.  Taking advil and helps some.  Today hamstring area hurts as well.  Protocols used: Leg Pain-A-AH

## 2023-10-25 NOTE — Progress Notes (Unsigned)
 Acute Office Visit  Subjective:    Patient ID: Kathleen Wells, female    DOB: 1960/02/22, 63 y.o.   MRN: [id number]  Chief Complaint  Patient presents with   Leg Pain    BL thigh pain x 2 weeks, standing worsens pain. ASA helps somes. Pain sharp and achy at times.    Discussed the use of AI scribe software for clinical note transcription with the patient, who gave verbal consent to proceed.  History of Present Illness Kathleen Wells is a 63 year old female who presents with bilateral thigh pain and muscle aches.  Bilateral thigh and hamstring myalgia - Bilateral thigh pain and muscle aches for the past two weeks - Pain is aching in quality, primarily occurs when standing or walking, relieved by sitting - Pain severity rated as 10/10 at its worst - Pain has recently started to involve the hamstrings - Tylenol and Advil provide partial relief, but pain persists - Tried vitamin B1 without improvement - No radiation of pain down the legs or electrical quality  Chronic knee arthralgia - Chronic knee discomfort, especially in cold weather - Attributed to calcium  deposits in the knees  Varicose veins - History of varicose veins, previously evaluated and determined to be cosmetic  Medication history - No new medications since last visit - Long-term use of Lipitor  Constitutional and systemic symptoms - No fevers, chills, sweats, earaches, sore throat, or stuffy nose - No chest pain, breathing problems, bowel problems, or bladder issues  Patient noted to be overdue for her chronic medical problems.   DMII: No medications-Diet controlled,  check checks feet daily. Eye appt scheduled in July 2025.   Hyperlipidemia: Lipitor 40 mg daily. ASA 81 mg daily. Has been on lipitor for years.  Eating healthy (avoids sugar and caffeine) and exercising.   HTN: Olmesartan   20 mg daily  COPD: Breztri  and Albuterol . Continues to smoke. 1/2 ppd.  Trying to quit. Decreasing.   Sleep apnea:   wears CPAP every night.  Sleep much improved.  Past Medical History:  Diagnosis Date   Aortic atherosclerosis    Benign positional vertigo, bilateral 03/05/2023   Class 1 obesity with serious comorbidity and body mass index (BMI) of 33.0 to 33.9 in adult 10/27/2022   COPD (chronic obstructive pulmonary disease) (HCC)    Coronary atherosclerosis 06/15/2023   Essential (primary) hypertension    Hyperlipidemia associated with type 2 diabetes mellitus (HCC) 07/07/2022   Hypertension associated with diabetes (HCC) 02/17/2019   Mixed hyperlipidemia    Nicotine dependence 11/22/2019   Non-recurrent acute suppurative otitis media of left ear without spontaneous rupture of tympanic membrane 03/05/2023   Obstructive sleep apnea (adult) (pediatric)    Simple chronic bronchitis (HCC) 03/02/2021   Type 2 diabetes mellitus with other specified complication (HCC)    Vertigo 03/05/2023    Past Surgical History:  Procedure Laterality Date   NO PAST SURGERIES      Family History  Problem Relation Age of Onset   Lung cancer Mother    Congestive Heart Failure Father    Lupus Sister    Stomach cancer Maternal Grandmother    Hepatitis C Brother    Breast cancer Neg Hx     Social History   Socioeconomic History   Marital status: Media Planner    Spouse name: Not on file   Number of children: 0   Years of education: Not on file   Highest education level: Not on file  Occupational History  Occupation: OFFICE  Tobacco Use   Smoking status: Every Day    Current packs/day: 0.50    Average packs/day: 0.5 packs/day for 42.0 years (21.0 ttl pk-yrs)    Types: Cigarettes   Smokeless tobacco: Never  Vaping Use   Vaping status: Never Used  Substance and Sexual Activity   Alcohol use: Yes    Alcohol/week: 8.0 standard drinks of alcohol    Types: 4 Cans of beer, 4 Standard drinks or equivalent per week   Drug use: Never   Sexual activity: Not Currently  Other Topics Concern   Not on file   Social History Narrative   Not on file   Social Drivers of Health   Financial Resource Strain: Low Risk  (03/02/2021)   Overall Financial Resource Strain (CARDIA)    Difficulty of Paying Living Expenses: Not hard at all  Food Insecurity: Low Risk  (03/16/2023)   Received from [facility]   Hunger Vital Sign    Within the past 12 months, you worried that your food would run out before you got money to buy more: Never true    Within the past 12 months, the food you bought just didn't last and you didn't have money to get more. : Never true  Transportation Needs: No Transportation Needs (03/16/2023)   Received from [facility]    In the past 12 months, has lack of reliable transportation kept you from medical appointments, meetings, work or from getting things needed for daily living? : No  Physical Activity: Insufficiently Active (07/07/2022)   Exercise Vital Sign    Days of Exercise per Week: 7 days    Minutes of Exercise per Session: 20 min  Stress: No Stress Concern Present (03/02/2021)   [employer] of Occupational Health - Occupational Stress Questionnaire    Feeling of Stress : Not at all  Social Connections: Moderately Isolated (03/03/2022)   Social Connection and Isolation Panel    Frequency of Communication with Friends and Family: Three times a week    Frequency of Social Gatherings with Friends and Family: Three times a week    Attends Religious Services: Never    Active Member of Clubs or Organizations: No    Attends Banker Meetings: Never    Marital Status: Living with partner  Intimate Partner Violence: Not At Risk (10/26/2023)   Humiliation, Afraid, Rape, and Kick questionnaire    Fear of Current or Ex-Partner: No    Emotionally Abused: No    Physically Abused: No    Sexually Abused: No    Outpatient Medications Prior to Visit  Medication Sig Dispense Refill   ACCU-CHEK GUIDE test strip USE TO TEST UP TO 4 TIMES DAILY AS  DIRECTED 100 strip 2   albuterol  (VENTOLIN  HFA) 108 (90 Base) MCG/ACT inhaler TAKE 2 PUFFS BY MOUTH EVERY 6 HOURS AS NEEDED FOR WHEEZE OR SHORTNESS OF BREATH 8.5 each 2   aspirin 81 MG chewable tablet Chew 81 mg by mouth daily.     atorvastatin  (LIPITOR) 40 MG tablet TAKE 1 TABLET BY MOUTH EVERY DAY 90 tablet 1   BREZTRI  AEROSPHERE 160-9-4.8 MCG/ACT AERO INHALE 2 PUFFS INTO THE LUNGS IN THE MORNING AND AT BEDTIME. 10.7 each 11   fluticasone  (FLONASE ) 50 MCG/ACT nasal spray SPRAY 2 SPRAYS INTO EACH NOSTRIL EVERY DAY 48 mL 2   olmesartan  (BENICAR ) 20 MG tablet TAKE 1 TABLET BY MOUTH EVERY DAY 90 tablet 1   No facility-administered medications prior to visit.  Allergies  Allergen Reactions   Lisinopril Diarrhea   Meclizine  Other (See Comments)    MADE VERY SLEEPY   Septra [Sulfamethoxazole-Trimethoprim] Hives   Sulfa Antibiotics Hives    Review of Systems  All other systems reviewed and are negative.      Objective:        10/26/2023   10:49 AM 06/15/2023   10:19 AM 05/03/2023    9:50 AM  Vitals with BMI  Height 5' 9 5' 9 5' 9  Weight 225 lbs 227 lbs 13 oz 221 lbs  BMI 33.21 33.62 32.62  Systolic 136 132 861  Diastolic 70 82 72  Pulse 76 75 75    No data found.   Physical Exam Vitals reviewed.  Constitutional:      Appearance: Normal appearance. She is obese.  Neck:     Vascular: No carotid bruit.  Cardiovascular:     Rate and Rhythm: Normal rate and regular rhythm.     Heart sounds: Normal heart sounds.  Pulmonary:     Effort: Pulmonary effort is normal. No respiratory distress.     Breath sounds: Normal breath sounds.  Abdominal:     General: Abdomen is flat. Bowel sounds are normal.     Palpations: Abdomen is soft.     Tenderness: There is no abdominal tenderness.  Musculoskeletal:        General: Tenderness (lumbar) present. No deformity. Normal range of motion.     Right hip: Normal.     Left hip: Normal.     Right upper leg: Normal.     Left  upper leg: Normal.     Right knee: Normal.     Left knee: Normal.     Right lower leg: Normal.     Left lower leg: Normal.  Neurological:     Mental Status: She is alert and oriented to Wells, place, and time.  Psychiatric:        Mood and Affect: Mood normal.        Behavior: Behavior normal.     Health Maintenance Due  Topic Date Due   FOOT EXAM  03/04/2023    There are no preventive care reminders to display for this patient.   Lab Results  Component Value Date   TSH 0.604 10/26/2023   Lab Results  Component Value Date   WBC 9.7 10/26/2023   HGB 14.3 10/26/2023   HCT 42.3 10/26/2023   MCV 100 (H) 10/26/2023   PLT 313 10/26/2023   Lab Results  Component Value Date   NA 142 10/26/2023   K 4.4 10/26/2023   CO2 21 10/26/2023   GLUCOSE 101 (H) 10/26/2023   BUN 13 10/26/2023   CREATININE 0.58 10/26/2023   BILITOT 0.6 10/26/2023   ALKPHOS 113 10/26/2023   AST 21 10/26/2023   ALT 23 10/26/2023   PROT 6.7 10/26/2023   ALBUMIN 4.6 10/26/2023   CALCIUM  9.9 10/26/2023   EGFR 102 10/26/2023   Lab Results  Component Value Date   CHOL 147 10/26/2023   Lab Results  Component Value Date   HDL 73 10/26/2023   Lab Results  Component Value Date   LDLCALC 59 10/26/2023   Lab Results  Component Value Date   TRIG 75 10/26/2023   Lab Results  Component Value Date   CHOLHDL 2.0 10/26/2023   Lab Results  Component Value Date   HGBA1C 5.7 (H) 10/26/2023        Results for orders placed or performed in  visit on 10/26/23  CK   Collection Time: 10/26/23 11:27 AM  Result Value Ref Range   Total CK 19 (L) 32 - 182 U/L  Magnesium   Collection Time: 10/26/23 11:27 AM  Result Value Ref Range   Magnesium 2.2 1.6 - 2.3 mg/dL  CBC with Differential/Platelet   Collection Time: 10/26/23 11:27 AM  Result Value Ref Range   WBC 9.7 3.4 - 10.8 x10E3/uL   RBC 4.25 3.77 - 5.28 x10E6/uL   Hemoglobin 14.3 11.1 - 15.9 g/dL   Hematocrit 57.6 65.9 - 46.6 %   MCV 100 (H)  79 - 97 fL   MCH 33.6 (H) 26.6 - 33.0 pg   MCHC 33.8 31.5 - 35.7 g/dL   RDW 87.6 88.2 - 84.5 %   Platelets 313 150 - 450 x10E3/uL   Neutrophils 53 Not Estab. %   Lymphs 38 Not Estab. %   Monocytes 6 Not Estab. %   Eos 2 Not Estab. %   Basos 1 Not Estab. %   Neutrophils Absolute 5.2 1.4 - 7.0 x10E3/uL   Lymphocytes Absolute 3.7 (H) 0.7 - 3.1 x10E3/uL   Monocytes Absolute 0.5 0.1 - 0.9 x10E3/uL   EOS (ABSOLUTE) 0.2 0.0 - 0.4 x10E3/uL   Basophils Absolute 0.1 0.0 - 0.2 x10E3/uL   Immature Granulocytes 0 Not Estab. %   Immature Grans (Abs) 0.0 0.0 - 0.1 x10E3/uL  Comprehensive metabolic panel with GFR   Collection Time: 10/26/23 11:27 AM  Result Value Ref Range   Glucose 101 (H) 70 - 99 mg/dL   BUN 13 8 - 27 mg/dL   Creatinine, Ser 9.41 0.57 - 1.00 mg/dL   eGFR 897 >40 fO/fpw/8.26   BUN/Creatinine Ratio 22 12 - 28   Sodium 142 134 - 144 mmol/L   Potassium 4.4 3.5 - 5.2 mmol/L   Chloride 105 96 - 106 mmol/L   CO2 21 20 - 29 mmol/L   Calcium  9.9 8.7 - 10.3 mg/dL   Total Protein 6.7 6.0 - 8.5 g/dL   Albumin 4.6 3.9 - 4.9 g/dL   Globulin, Total 2.1 1.5 - 4.5 g/dL   Bilirubin Total 0.6 0.0 - 1.2 mg/dL   Alkaline Phosphatase 113 49 - 135 IU/L   AST 21 0 - 40 IU/L   ALT 23 0 - 32 IU/L  Hemoglobin A1c   Collection Time: 10/26/23 11:27 AM  Result Value Ref Range   Hgb A1c MFr Bld 5.7 (H) 4.8 - 5.6 %   Est. average glucose Bld gHb Est-mCnc 117 mg/dL  Lipid panel   Collection Time: 10/26/23 11:27 AM  Result Value Ref Range   Cholesterol, Total 147 100 - 199 mg/dL   Triglycerides 75 0 - 149 mg/dL   HDL 73 >60 mg/dL   VLDL Cholesterol Cal 15 5 - 40 mg/dL   LDL Chol Calc (NIH) 59 0 - 99 mg/dL   Chol/HDL Ratio 2.0 0.0 - 4.4 ratio  TSH   Collection Time: 10/26/23 11:27 AM  Result Value Ref Range   TSH 0.604 0.450 - 4.500 uIU/mL     Assessment & Plan:   Assessment & Plan Pain in both lower extremities Pain exacerbated by standing, relieved by sitting. Differential includes  spinal stenosis and muscle-related causes. Vascular issues less likely due to good pulses. Unlikely muscle-related due to long-term Lipitor use without prior issues. - Order x-ray of the low back to assess for arthritis. - Order vascular study of the legs to evaluate blood flow. - Prescribe ibuprofen 800  mg three times daily for two weeks. - Continue Lipitor until further evaluation. Orders:   CK   Magnesium   ibuprofen (ADVIL) 800 MG tablet; Take 1 tablet (800 mg total) by mouth every 8 (eight) hours as needed.   TSH  Chronic bilateral low back pain without sciatica Intermittent pain possibly related to standing. Differential includes spinal stenosis and compensatory mechanisms due to leg pain. - Order x-ray of the low back to assess for arthritis. - Prescribe ibuprofen 800 mg three times daily for two weeks. Orders:   DG Lumbar Spine Complete; Future   ibuprofen (ADVIL) 800 MG tablet; Take 1 tablet (800 mg total) by mouth every 8 (eight) hours as needed.  Essential (primary) hypertension Well controlled.  No changes to medicines.  Continue to work on eating a healthy diet and exercise.  Labs drawn today.      Hyperlipidemia associated with type 2 diabetes mellitus (HCC) Lipitor use noted. Muscle-related side effects unlikely due to long-term use without issues. - Continue Lipitor until further evaluation. - Order blood work including lipid panel. Orders:   CBC with Differential/Platelet   Comprehensive metabolic panel with GFR   Hemoglobin A1c   Lipid panel   TSH  Aortic atherosclerosis Management as reviewed under coronary atherosclerosis above.     Intermittent claudication Pain exacerbated by standing, relieved by sitting. Differential includes spinal stenosis and muscle-related causes. Vascular issues less likely due to good pulses. Unlikely muscle-related due to long-term Lipitor use without prior issues. - Order x-ray of the low back to assess for arthritis. -  Order vascular study of the legs to evaluate blood flow. - Prescribe ibuprofen 800 mg three times daily for two weeks. - Continue Lipitor until further evaluation. Orders:   VAS US  ABI WITH/WO TBI; Future     Body mass index is 33.23 kg/m..   Meds ordered this encounter  Medications   ibuprofen (ADVIL) 800 MG tablet    Sig: Take 1 tablet (800 mg total) by mouth every 8 (eight) hours as needed.    Dispense:  90 tablet    Refill:  0    Orders Placed This Encounter  Procedures   DG Lumbar Spine Complete   CK   Magnesium   CBC with Differential/Platelet   Comprehensive metabolic panel with GFR   Hemoglobin A1c   Lipid panel   TSH   VAS US  ABI WITH/WO TBI     Follow-up: Return in about 8 days (around 11/03/2023) for cpe.  An After Visit Summary was printed and given to the patient.   LILLETTE Kato I Leal-Borjas,acting as a scribe for Abigail Free, MD.,have documented all relevant documentation on the behalf of Abigail Free, MD,as directed by  Abigail Free, MD while in the presence of Abigail Free, MD.   I attest that I have reviewed this visit and agree with the plan scribed by my staff.   Abigail Free, MD [facility] [phone]

## 2023-10-26 ENCOUNTER — Ambulatory Visit: Admitting: Family Medicine

## 2023-10-26 ENCOUNTER — Encounter: Payer: Self-pay | Admitting: Family Medicine

## 2023-10-26 VITALS — BP 136/70 | HR 76 | Temp 98.1°F | Ht 69.0 in | Wt 225.0 lb

## 2023-10-26 DIAGNOSIS — M79605 Pain in left leg: Secondary | ICD-10-CM

## 2023-10-26 DIAGNOSIS — M545 Low back pain, unspecified: Secondary | ICD-10-CM | POA: Diagnosis not present

## 2023-10-26 DIAGNOSIS — I10 Essential (primary) hypertension: Secondary | ICD-10-CM

## 2023-10-26 DIAGNOSIS — I739 Peripheral vascular disease, unspecified: Secondary | ICD-10-CM

## 2023-10-26 DIAGNOSIS — E785 Hyperlipidemia, unspecified: Secondary | ICD-10-CM

## 2023-10-26 DIAGNOSIS — E1169 Type 2 diabetes mellitus with other specified complication: Secondary | ICD-10-CM

## 2023-10-26 DIAGNOSIS — M79604 Pain in right leg: Secondary | ICD-10-CM | POA: Diagnosis not present

## 2023-10-26 DIAGNOSIS — I700 Atherosclerosis of aorta: Secondary | ICD-10-CM

## 2023-10-26 DIAGNOSIS — G8929 Other chronic pain: Secondary | ICD-10-CM

## 2023-10-26 MED ORDER — IBUPROFEN 800 MG PO TABS: 800.0000 mg | ORAL_TABLET | Freq: Three times a day (TID) | ORAL | 0 refills | Status: DC | PRN

## 2023-10-26 NOTE — Patient Instructions (Signed)
  VISIT SUMMARY: During your visit, we discussed your bilateral thigh and hamstring pain, chronic knee discomfort, and history of varicose veins. We have planned several tests and treatments to help manage your symptoms and determine the underlying causes.  YOUR PLAN: CHRONIC BILATERAL THIGH AND HAMSTRING PAIN: You have been experiencing severe pain in your thighs and hamstrings, especially when standing or walking. -We will order an x-ray of your lower back to check for arthritis  -We will also order a vascular study of your legs to evaluate blood flow. -You should take ibuprofen 800 mg three times daily for two weeks. -Continue taking Lipitor as usual until we have more information.  LOW BACK PAIN: You have intermittent low back pain that may be related to your leg pain. -We will order an x-ray of your lower back to check for arthritis. -You should take ibuprofen 800 mg three times daily for two weeks.  HYPERLIPIDEMIA: You are taking Lipitor to manage your cholesterol levels. -Continue taking Lipitor as usual until we have more information. -We will order blood work, including a lipid panel, to check your cholesterol levels.  I would recommend scheduling coronary ct scan that Dr. Liborio recommended                      Contains text generated by [employer].                                 Contains text generated by [employer].

## 2023-10-27 LAB — CBC WITH DIFFERENTIAL/PLATELET
Basophils Absolute: 0.1 x10E3/uL (ref 0.0–0.2)
Basos: 1 %
EOS (ABSOLUTE): 0.2 x10E3/uL (ref 0.0–0.4)
Eos: 2 %
Hematocrit: 42.3 % (ref 34.0–46.6)
Hemoglobin: 14.3 g/dL (ref 11.1–15.9)
Immature Grans (Abs): 0 x10E3/uL (ref 0.0–0.1)
Immature Granulocytes: 0 %
Lymphocytes Absolute: 3.7 x10E3/uL — ABNORMAL HIGH (ref 0.7–3.1)
Lymphs: 38 %
MCH: 33.6 pg — ABNORMAL HIGH (ref 26.6–33.0)
MCHC: 33.8 g/dL (ref 31.5–35.7)
MCV: 100 fL — ABNORMAL HIGH (ref 79–97)
Monocytes Absolute: 0.5 x10E3/uL (ref 0.1–0.9)
Monocytes: 6 %
Neutrophils Absolute: 5.2 x10E3/uL (ref 1.4–7.0)
Neutrophils: 53 %
Platelets: 313 x10E3/uL (ref 150–450)
RBC: 4.25 x10E6/uL (ref 3.77–5.28)
RDW: 12.3 % (ref 11.7–15.4)
WBC: 9.7 x10E3/uL (ref 3.4–10.8)

## 2023-10-27 LAB — COMPREHENSIVE METABOLIC PANEL WITH GFR
ALT: 23 IU/L (ref 0–32)
AST: 21 IU/L (ref 0–40)
Albumin: 4.6 g/dL (ref 3.9–4.9)
Alkaline Phosphatase: 113 IU/L (ref 49–135)
BUN/Creatinine Ratio: 22 (ref 12–28)
BUN: 13 mg/dL (ref 8–27)
Bilirubin Total: 0.6 mg/dL (ref 0.0–1.2)
CO2: 21 mmol/L (ref 20–29)
Calcium: 9.9 mg/dL (ref 8.7–10.3)
Chloride: 105 mmol/L (ref 96–106)
Creatinine, Ser: 0.58 mg/dL (ref 0.57–1.00)
Globulin, Total: 2.1 g/dL (ref 1.5–4.5)
Glucose: 101 mg/dL — ABNORMAL HIGH (ref 70–99)
Potassium: 4.4 mmol/L (ref 3.5–5.2)
Sodium: 142 mmol/L (ref 134–144)
Total Protein: 6.7 g/dL (ref 6.0–8.5)
eGFR: 102 mL/min/1.73 (ref >59)

## 2023-10-27 LAB — HEMOGLOBIN A1C
Est. average glucose Bld gHb Est-mCnc: 117 mg/dL
Hgb A1c MFr Bld: 5.7 % — ABNORMAL HIGH (ref 4.8–5.6)

## 2023-10-27 LAB — MAGNESIUM: Magnesium: 2.2 mg/dL (ref 1.6–2.3)

## 2023-10-27 LAB — LIPID PANEL
Chol/HDL Ratio: 2 ratio (ref 0.0–4.4)
Cholesterol, Total: 147 mg/dL (ref 100–199)
HDL: 73 mg/dL (ref >39)
LDL Chol Calc (NIH): 59 mg/dL (ref 0–99)
Triglycerides: 75 mg/dL (ref 0–149)
VLDL Cholesterol Cal: 15 mg/dL (ref 5–40)

## 2023-10-27 LAB — TSH: TSH: 0.604 u[IU]/mL (ref 0.450–4.500)

## 2023-10-27 LAB — CK: Total CK: 19 U/L — ABNORMAL LOW (ref 32–182)

## 2023-10-29 ENCOUNTER — Ambulatory Visit (INDEPENDENT_AMBULATORY_CARE_PROVIDER_SITE_OTHER)
Admission: RE | Admit: 2023-10-29 | Discharge: 2023-10-29 | Disposition: A | Discharge status: Home / Self Care | Source: Ambulatory Visit | Attending: Family Medicine | Admitting: Family Medicine

## 2023-10-29 DIAGNOSIS — M545 Low back pain, unspecified: Secondary | ICD-10-CM | POA: Diagnosis not present

## 2023-10-29 DIAGNOSIS — G8929 Other chronic pain: Secondary | ICD-10-CM | POA: Diagnosis not present

## 2023-10-30 NOTE — Assessment & Plan Note (Signed)
 Management as reviewed under coronary atherosclerosis above.

## 2023-10-30 NOTE — Assessment & Plan Note (Signed)
Well controlled.  ?No changes to medicines.  ?Continue to work on eating a healthy diet and exercise.  ?Labs drawn today.  ?

## 2023-10-30 NOTE — Assessment & Plan Note (Signed)
 Lipitor use noted. Muscle-related side effects unlikely due to long-term use without issues. - Continue Lipitor until further evaluation. - Order blood work including lipid panel. Orders:   CBC with Differential/Platelet   Comprehensive metabolic panel with GFR   Hemoglobin A1c   Lipid panel   TSH

## 2023-11-01 ENCOUNTER — Ambulatory Visit: Payer: Self-pay | Admitting: Family Medicine

## 2023-11-02 NOTE — Progress Notes (Signed)
 Subjective:  Patient ID: Kathleen Wells, female    DOB: 27-Oct-1960  Age: 63 y.o. MRN: [id number]  No chief complaint on file.   Well Adult Physical: Patient here for a comprehensive physical exam.The patient reports {problems:16946} Do you take any herbs or supplements that were not prescribed by a doctor? {yes/no/not asked:9010} Are you taking calcium  supplements? {yes/no:63} Are you taking aspirin daily? {yes/no:63}  Encounter for general adult medical examination without abnormal findings  Physical (At Risk items are starred): Patient's last physical exam was 1 year ago .  Patient is not afflicted from Stress Incontinence and Urge Incontinence  Patient wears a seat belts Patient has smoke detectors and has carbon monoxide detectors. Patient practices appropriate gun safety. Patient wears sunscreen with extended sun exposure. Dental Care: biannual cleanings, brushes and flosses daily. Ophthalmology/Optometry: Annual visit.  Hearing loss: none Vision impairments: none  Menarche: *** Menstrual History: *** LMP: *** Pregnancy history: *** Safe at home: *** Self breast exams: ***     03/05/2023    8:52 AM 10/27/2022    8:17 AM 07/07/2022    8:01 AM 03/03/2022    8:03 AM 08/26/2021    8:04 AM  Depression screen PHQ 2/9  Decreased Interest 0 0 0 0 0  Down, Depressed, Hopeless 1 0 1 0 0  PHQ - 2 Score 1 0 1 0 0  Altered sleeping 1 0 1    Tired, decreased energy 0 0 0    Change in appetite 0 0 0    Feeling bad or failure about yourself  0 0 0    Trouble concentrating 0 0 0    Moving slowly or fidgety/restless 0 0 0    Suicidal thoughts 0 0 0    PHQ-9 Score 2 0 2    Difficult doing work/chores Somewhat difficult Not difficult at all Not difficult at all           08/21/2020    8:17 AM 08/26/2021    8:04 AM 03/03/2022    8:03 AM 10/27/2022    8:17 AM 03/05/2023    8:52 AM  Fall Risk  Falls in the past year? 0 0 0 0 0  Was there an injury with Fall? 0 0 0 0 0  Fall Risk  Category Calculator 0 0 0 0 0  Fall Risk Category (Retired) Low  Low      (RETIRED) Patient Fall Risk Level Low fall risk  Low fall risk      Patient at Risk for Falls Due to No Fall Risks Impaired vision No Fall Risks No Fall Risks No Fall Risks  Fall risk Follow up Falls evaluation completed  Falls evaluation completed  Falls evaluation completed Falls evaluation completed Falls evaluation completed     Data saved with a previous flowsheet row definition             Social Hx   Social History   Socioeconomic History   Marital status: Media Planner    Spouse name: Not on file   Number of children: 0   Years of education: Not on file   Highest education level: Not on file  Occupational History   Occupation: OFFICE  Tobacco Use   Smoking status: Every Day    Current packs/day: 0.50    Average packs/day: 0.5 packs/day for 42.0 years (21.0 ttl pk-yrs)    Types: Cigarettes   Smokeless tobacco: Never  Vaping Use   Vaping status: Never Used  Substance and Sexual  Activity   Alcohol use: Yes    Alcohol/week: 8.0 standard drinks of alcohol    Types: 4 Cans of beer, 4 Standard drinks or equivalent per week   Drug use: Never   Sexual activity: Not Currently  Other Topics Concern   Not on file  Social History Narrative   Not on file   Social Drivers of Health   Financial Resource Strain: Low Risk  (03/02/2021)   Overall Financial Resource Strain (CARDIA)    Difficulty of Paying Living Expenses: Not hard at all  Food Insecurity: Low Risk  (03/16/2023)   Received from [facility]   Hunger Vital Sign    Within the past 12 months, you worried that your food would run out before you got money to buy more: Never true    Within the past 12 months, the food you bought just didn't last and you didn't have money to get more. : Never true  Transportation Needs: No Transportation Needs (03/16/2023)   Received from [facility]    In the past 12 months, has lack of  reliable transportation kept you from medical appointments, meetings, work or from getting things needed for daily living? : No  Physical Activity: Insufficiently Active (07/07/2022)   Exercise Vital Sign    Days of Exercise per Week: 7 days    Minutes of Exercise per Session: 20 min  Stress: No Stress Concern Present (03/02/2021)   [employer] of Occupational Health - Occupational Stress Questionnaire    Feeling of Stress : Not at all  Social Connections: Moderately Isolated (03/03/2022)   Social Connection and Isolation Panel    Frequency of Communication with Friends and Family: Three times a week    Frequency of Social Gatherings with Friends and Family: Three times a week    Attends Religious Services: Never    Active Member of Clubs or Organizations: No    Attends Banker Meetings: Never    Marital Status: Living with partner   Past Medical History:  Diagnosis Date   Aortic atherosclerosis    Benign positional vertigo, bilateral 03/05/2023   Class 1 obesity with serious comorbidity and body mass index (BMI) of 33.0 to 33.9 in adult 10/27/2022   COPD (chronic obstructive pulmonary disease) (HCC)    Coronary atherosclerosis 06/15/2023   Essential (primary) hypertension    Hyperlipidemia associated with type 2 diabetes mellitus (HCC) 07/07/2022   Hypertension associated with diabetes (HCC) 02/17/2019   Mixed hyperlipidemia    Nicotine dependence 11/22/2019   Non-recurrent acute suppurative otitis media of left ear without spontaneous rupture of tympanic membrane 03/05/2023   Obstructive sleep apnea (adult) (pediatric)    Simple chronic bronchitis (HCC) 03/02/2021   Type 2 diabetes mellitus with other specified complication (HCC)    Vertigo 03/05/2023   Past Surgical History:  Procedure Laterality Date   NO PAST SURGERIES      Family History  Problem Relation Age of Onset   Lung cancer Mother    Congestive Heart Failure Father    Lupus Sister    Stomach  cancer Maternal Grandmother    Hepatitis C Brother    Breast cancer Neg Hx     Review of Systems   Objective:  There were no vitals taken for this visit.     10/26/2023   10:49 AM 06/15/2023   10:19 AM 05/03/2023    9:50 AM  BP/Weight  Systolic BP 136 132 138  Diastolic BP 70 82 72  Wt. (Lbs) 225 227.8 221  BMI 33.23 kg/m2 33.64 kg/m2 32.64 kg/m2    Physical Exam  Lab Results  Component Value Date   WBC 9.7 10/26/2023   HGB 14.3 10/26/2023   HCT 42.3 10/26/2023   PLT 313 10/26/2023   GLUCOSE 101 (H) 10/26/2023   CHOL 147 10/26/2023   TRIG 75 10/26/2023   HDL 73 10/26/2023   LDLCALC 59 10/26/2023   ALT 23 10/26/2023   AST 21 10/26/2023   NA 142 10/26/2023   K 4.4 10/26/2023   CL 105 10/26/2023   CREATININE 0.58 10/26/2023   BUN 13 10/26/2023   CO2 21 10/26/2023   TSH 0.604 10/26/2023   HGBA1C 5.7 (H) 10/26/2023   MICROALBUR 10 02/22/2020      Assessment & Plan:   There are no diagnoses linked to this encounter.   There is no height or weight on file to calculate BMI.   These are the goals we discussed:  Goals   None      This is a list of the screening recommended for you and due dates:  Health Maintenance  Topic Date Due   Complete foot exam   03/04/2023   Cologuard (Stool DNA test)  12/31/2023   DTaP/Tdap/Td vaccine (2 - Td or Tdap) 03/11/2024   Hemoglobin A1C  04/25/2024   Yearly kidney health urinalysis for diabetes  05/02/2024   Screening for Lung Cancer  05/13/2024   Breast Cancer Screening  05/31/2024   Eye exam for diabetics  08/15/2024   Yearly kidney function blood test for diabetes  10/25/2024   Pap with HPV screening  11/21/2024   Pneumococcal Vaccine for age over 13  Completed   Flu Shot  Completed   COVID-19 Vaccine  Completed   Hepatitis C Screening  Completed   HIV Screening  Completed   Zoster (Shingles) Vaccine  Completed   Hepatitis B Vaccine  Aged Out   HPV Vaccine  Aged Out   Meningitis B Vaccine  Aged Out     No  orders of the defined types were placed in this encounter.   Follow-up: No follow-ups on file.  An After Visit Summary was printed and given to the patient.  Abigail Free, MD [facility] [phone]

## 2023-11-03 ENCOUNTER — Ambulatory Visit: Admitting: Family Medicine

## 2023-11-03 VITALS — BP 138/76 | HR 86 | Temp 98.2°F | Ht 69.0 in | Wt 227.0 lb

## 2023-11-03 DIAGNOSIS — Z0001 Encounter for general adult medical examination with abnormal findings: Secondary | ICD-10-CM | POA: Diagnosis not present

## 2023-11-03 DIAGNOSIS — M79604 Pain in right leg: Secondary | ICD-10-CM

## 2023-11-03 DIAGNOSIS — M79605 Pain in left leg: Secondary | ICD-10-CM

## 2023-11-03 DIAGNOSIS — Z6833 Body mass index (BMI) 33.0-33.9, adult: Secondary | ICD-10-CM | POA: Diagnosis not present

## 2023-11-03 NOTE — Patient Instructions (Addendum)
 VISIT SUMMARY: Today, we discussed your persistent leg pain, possible side effects of Lipitor, and general health maintenance. We also reviewed your smoking habits and strategies for reduction.  YOUR PLAN: BILATERAL LEG PAIN: You have chronic pain in both legs, more severe in the left leg, which may be related to your use of Lipitor. -Stop taking Lipitor for 3-4 weeks to see if it affects your leg pain. -Continue taking ibuprofen for pain relief. -Incorporate stretching exercises for both the back and front of your legs.  DEGENERATIVE DISC DISEASE, LUMBAR REGION (L4-L5, L5-S1): You have mild to moderate degenerative disc disease in your lower back, which is not believed to be causing your current leg pain.  HYPERLIPIDEMIA: Your cholesterol levels are well-controlled with Lipitor, which we are temporarily stopping to see if it affects your leg pain. -We will reassess your cholesterol levels after the Lipitor hold period.  TOBACCO USE DISORDER: You currently smoke half a pack of cigarettes per day and have reduced from previous levels. -Try to reduce smoking to a quarter pack per day. -We discussed various strategies for quitting smoking.  ADULT WELLNESS VISIT: We reviewed general health maintenance, safety, and lifestyle habits. -Always use a seatbelt. -Ensure you have functional smoke and carbon monoxide detectors at home. -Practice gun safety if you have firearms. -Use sunscreen to protect your skin from the sun. -Maintain regular dental visits. -Continue with your annual eye exams.  Preventive Care 63-63 Years Old, Female Preventive care refers to lifestyle choices and visits with your health care provider that can promote health and wellness. Preventive care visits are also called wellness exams. What can I expect for my preventive care visit? Counseling Your health care provider may ask you questions about your: Medical history, including: Past medical problems. Family medical  history. Pregnancy history. Current health, including: Menstrual cycle. Method of birth control. Emotional well-being. Home life and relationship well-being. Sexual activity and sexual health. Lifestyle, including: Alcohol, nicotine or tobacco, and drug use. Access to firearms. Diet, exercise, and sleep habits. Work and work astronomer. Sunscreen use. Safety issues such as seatbelt and bike helmet use. Physical exam Your health care provider will check your: Height and weight. These may be used to calculate your BMI (body mass index). BMI is a measurement that tells if you are at a healthy weight. Waist circumference. This measures the distance around your waistline. This measurement also tells if you are at a healthy weight and may help predict your risk of certain diseases, such as type 2 diabetes and high blood pressure. Heart rate and blood pressure. Body temperature. Skin for abnormal spots. What immunizations do I need?  Vaccines are usually given at various ages, according to a schedule. Your health care provider will recommend vaccines for you based on your age, medical history, and lifestyle or other factors, such as travel or where you work. What tests do I need? Screening Your health care provider may recommend screening tests for certain conditions. This may include: Lipid and cholesterol levels. Diabetes screening. This is done by checking your blood sugar (glucose) after you have not eaten for a while (fasting). Pelvic exam and Pap test. Hepatitis B test. Hepatitis C test. HIV (human immunodeficiency virus) test. STI (sexually transmitted infection) testing, if you are at risk. Lung cancer screening. Colorectal cancer screening. Mammogram. Talk with your health care provider about when you should start having regular mammograms. This may depend on whether you have a family history of breast cancer. BRCA-related cancer screening. This may  be done if you have a  family history of breast, ovarian, tubal, or peritoneal cancers. Bone density scan. This is done to screen for osteoporosis. Talk with your health care provider about your test results, treatment options, and if necessary, the need for more tests. Follow these instructions at home: Eating and drinking  Eat a diet that includes fresh fruits and vegetables, whole grains, lean protein, and low-fat dairy products. Take vitamin and mineral supplements as recommended by your health care provider. Do not drink alcohol if: Your health care provider tells you not to drink. You are pregnant, may be pregnant, or are planning to become pregnant. If you drink alcohol: Limit how much you have to 0-1 drink a day. Know how much alcohol is in your drink. In the [place] one drink equals one 12 oz bottle of beer (355 mL), one 5 oz glass of wine (148 mL), or one 1 oz glass of hard liquor (44 mL). Lifestyle Brush your teeth every morning and night with fluoride toothpaste. Floss one time each day. Exercise for at least 30 minutes 5 or more days each week. Do not use any products that contain nicotine or tobacco. These products include cigarettes, chewing tobacco, and vaping devices, such as e-cigarettes. If you need help quitting, ask your health care provider. Do not use drugs. If you are sexually active, practice safe sex. Use a condom or other form of protection to prevent STIs. If you do not wish to become pregnant, use a form of birth control. If you plan to become pregnant, see your health care provider for a prepregnancy visit. Take aspirin only as told by your health care provider. Make sure that you understand how much to take and what form to take. Work with your health care provider to find out whether it is safe and beneficial for you to take aspirin daily. Find healthy ways to manage stress, such as: Meditation, yoga, or listening to music. Journaling. Talking to a trusted person. Spending time with  friends and family. Minimize exposure to UV radiation to reduce your risk of skin cancer. Safety Always wear your seat belt while driving or riding in a vehicle. Do not drive: If you have been drinking alcohol. Do not ride with someone who has been drinking. When you are tired or distracted. While texting. If you have been using any mind-altering substances or drugs. Wear a helmet and other protective equipment during sports activities. If you have firearms in your house, make sure you follow all gun safety procedures. Seek help if you have been physically or sexually abused. What's next? Visit your health care provider once a year for an annual wellness visit. Ask your health care provider how often you should have your eyes and teeth checked. Stay up to date on all vaccines. This information is not intended to replace advice given to you by your health care provider. Make sure you discuss any questions you have with your health care provider. Document Revised: 06/19/2020 Document Reviewed: 06/19/2020 Elsevier Patient Education  2024 [employer].

## 2023-11-05 ENCOUNTER — Encounter: Payer: Self-pay | Admitting: Family Medicine

## 2023-11-05 DIAGNOSIS — Z0001 Encounter for general adult medical examination with abnormal findings: Secondary | ICD-10-CM | POA: Insufficient documentation

## 2023-11-05 DIAGNOSIS — M79604 Pain in right leg: Secondary | ICD-10-CM | POA: Insufficient documentation

## 2023-11-05 NOTE — Assessment & Plan Note (Signed)
 Recommend continue to work on eating healthy diet and exercise.

## 2023-11-05 NOTE — Assessment & Plan Note (Signed)
 Chronic bilateral leg pain, more severe in the left leg, possibly related to Lipitor use. Previous imaging showed mild to moderate degenerative disc disease at L4-L5 and L5-S1, not believed to be the cause. Low muscle protein levels indicative of muscle damage. - Hold Lipitor for 3-4 weeks to assess impact on leg pain. - Continue ibuprofen for pain management. - Encourage stretching exercises for both the back and front of the legs. - Mild to moderate degenerative disc disease at L4-L5 and L5-S1, most severe at L5-S1, not believed to be the cause of current leg pain.

## 2023-11-05 NOTE — Assessment & Plan Note (Signed)
 Adult Wellness Visit Routine adult wellness visit discussing general health maintenance, safety, and lifestyle habits. - Ensure seatbelt use. - Maintain functional smoke and carbon monoxide detectors. - Practice gun safety if firearms are acquired. - Use sunscreen for sun protection. - Maintain regular dental visits. - Continue annual eye exams. Chronic tobacco use, currently smoking half a pack per day. She expresses ambivalence about quitting but has reduced smoking from previous levels. - Encourage reduction to a quarter pack per day. - Discuss smoking cessation strategies.

## 2023-11-25 ENCOUNTER — Other Ambulatory Visit: Payer: Self-pay | Admitting: Family Medicine

## 2023-11-25 DIAGNOSIS — M545 Low back pain, unspecified: Secondary | ICD-10-CM

## 2023-11-25 DIAGNOSIS — M79604 Pain in right leg: Secondary | ICD-10-CM

## 2024-01-12 ENCOUNTER — Ambulatory Visit: Payer: Self-pay | Admitting: Physician Assistant

## 2024-01-12 ENCOUNTER — Ambulatory Visit: Payer: Self-pay

## 2024-01-12 ENCOUNTER — Ambulatory Visit: Admitting: Physician Assistant

## 2024-01-12 ENCOUNTER — Encounter: Payer: Self-pay | Admitting: Physician Assistant

## 2024-01-12 VITALS — BP 138/70 | HR 97 | Temp 98.7°F | Resp 18 | Ht 69.0 in | Wt 222.3 lb

## 2024-01-12 DIAGNOSIS — M5432 Sciatica, left side: Secondary | ICD-10-CM

## 2024-01-12 DIAGNOSIS — M5430 Sciatica, unspecified side: Secondary | ICD-10-CM | POA: Insufficient documentation

## 2024-01-12 DIAGNOSIS — M5431 Sciatica, right side: Secondary | ICD-10-CM | POA: Diagnosis not present

## 2024-01-12 DIAGNOSIS — J441 Chronic obstructive pulmonary disease with (acute) exacerbation: Secondary | ICD-10-CM

## 2024-01-12 DIAGNOSIS — R6889 Other general symptoms and signs: Secondary | ICD-10-CM

## 2024-01-12 LAB — POC INFLUENZA A&B (BINAX/QUICKVUE)
Influenza A, POC: NEGATIVE
Influenza B, POC: NEGATIVE

## 2024-01-12 LAB — POC COVID19 BINAXNOW: SARS Coronavirus 2 Ag: NEGATIVE

## 2024-01-12 MED ORDER — DOXYCYCLINE HYCLATE 100 MG PO TABS: 100.0000 mg | ORAL_TABLET | Freq: Two times a day (BID) | ORAL | 0 refills | Status: AC

## 2024-01-12 MED ORDER — ALBUTEROL SULFATE HFA 108 (90 BASE) MCG/ACT IN AERS: 2.0000 | INHALATION_SPRAY | RESPIRATORY_TRACT | 2 refills | Status: AC | PRN

## 2024-01-12 NOTE — Assessment & Plan Note (Signed)
 Test came back negative Continue to monitor symptoms Orders:   POC COVID-19   POC Influenza A&B (Binax test)

## 2024-01-12 NOTE — Assessment & Plan Note (Signed)
 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 Pain exacerbated by standing, relieved by sitting. - Ordered physical therapy for core, low back strengthening, and glute stretching. - Provided work restriction note to avoid standing over 15 minutes without sitting. - Advised to monitor symptoms, consider further evaluation if pain persists or worsens before March appointment with Dr. Sherre.

## 2024-01-12 NOTE — Assessment & Plan Note (Signed)
 COPD exacerbation Acute exacerbation with airway inflammation. Negative COVID and flu tests. Risk of pneumonia if symptoms worsen. - Prescribed doxycycline  100 mg orally twice a day for 7 days. - Refilled albuterol  inhaler with 18 gram size. - Advised albuterol  use as needed for wheezing. - Instructed to monitor for fever or worsening symptoms, report if pneumonia develops. - Discussed but declined steroid options. Orders:   doxycycline  (VIBRA -TABS) 100 MG tablet; Take 1 tablet (100 mg total) by mouth 2 (two) times daily.   albuterol  (VENTOLIN  HFA) 108 (90 Base) MCG/ACT inhaler; Inhale 2 puffs into the lungs every 4 (four) hours as needed for wheezing or shortness of breath.

## 2024-01-12 NOTE — Progress Notes (Signed)
 "  Acute Office Visit  Subjective:    Patient ID: Kathleen Wells, female    DOB: 1960-07-28, 64 y.o.   MRN: [id number]  No chief complaint on file.   HPI: Patient is in today for sinus and chest congestion  Discussed the use of AI scribe software for clinical note transcription with the patient, who gave verbal consent to proceed.  History of Present Illness Kathleen Wells is a 64 year old female with COPD who presents with worsening cough and sinus congestion.  She has been experiencing sinus congestion and a worsening cough since Saturday. She uses albuterol , previously prescribed for similar symptoms, at a frequency of six times a day to help clear and open her airways, and finds it effective. No fever, chest pain, nausea, or vomiting. She feels congested and heavy, with more trouble breathing at night, affecting her sleep. She often sits up and takes deep breaths to alleviate the sensation of not being able to breathe. Her cough is productive, with green sputum.  Her COVID and flu tests are currently negative.  She discusses a history of leg pain, which she associates with a back problem and possibly a pinched sciatic nerve. She experiences pain after standing for more than fifteen to twenty minutes, which improves upon sitting. She previously had a back x-ray and was taken off Lipitor, which she does not believe was the cause of her symptoms. She works at a [employer] in [place], which requires her to stand for extended periods, exacerbating her leg pain.  She uses a CPAP machine with moisture, which sometimes makes her lungs feel heavier after use. She does not have a nebulizer but mentions that her husband is checking on her albuterol  supply as she has been using it more frequently.     Past Medical History:  Diagnosis Date   Aortic atherosclerosis    Benign positional vertigo, bilateral 03/05/2023   Class 1 obesity with serious comorbidity and body mass index (BMI) of 33.0 to 33.9  in adult 10/27/2022   COPD (chronic obstructive pulmonary disease) (HCC)    Coronary atherosclerosis 06/15/2023   Essential (primary) hypertension    Hyperlipidemia associated with type 2 diabetes mellitus (HCC) 07/07/2022   Hypertension associated with diabetes (HCC) 02/17/2019   Mixed hyperlipidemia    Nicotine dependence 11/22/2019   Non-recurrent acute suppurative otitis media of left ear without spontaneous rupture of tympanic membrane 03/05/2023   Obstructive sleep apnea (adult) (pediatric)    Simple chronic bronchitis (HCC) 03/02/2021   Type 2 diabetes mellitus with other specified complication (HCC)    Vertigo 03/05/2023    Past Surgical History:  Procedure Laterality Date   NO PAST SURGERIES      Family History  Problem Relation Age of Onset   Lung cancer Mother    Congestive Heart Failure Father    Lupus Sister    Stomach cancer Maternal Grandmother    Hepatitis C Brother    Breast cancer Neg Hx     Social History   Socioeconomic History   Marital status: Media Planner    Spouse name: Not on file   Number of children: 0   Years of education: Not on file   Highest education level: Not on file  Occupational History   Occupation: OFFICE  Tobacco Use   Smoking status: Every Day    Current packs/day: 0.50    Average packs/day: 0.5 packs/day for 42.0 years (21.0 ttl pk-yrs)    Types: Cigarettes   Smokeless tobacco: Never  Vaping Use   Vaping status: Never Used  Substance and Sexual Activity   Alcohol use: Yes    Alcohol/week: 8.0 standard drinks of alcohol    Types: 4 Cans of beer, 4 Standard drinks or equivalent per week   Drug use: Never   Sexual activity: Not Currently  Other Topics Concern   Not on file  Social History Narrative   Not on file   Social Drivers of Health   Tobacco Use: High Risk (11/05/2023)   Patient History    Smoking Tobacco Use: Every Day    Smokeless Tobacco Use: Never    Passive Exposure: Not on file  Financial Resource  Strain: Low Risk (11/03/2023)   Overall Financial Resource Strain (CARDIA)    Difficulty of Paying Living Expenses: Not hard at all  Food Insecurity: Low Risk (03/16/2023)   Received from [facility]   Epic    Within the past 12 months, you worried that your food would run out before you got money to buy more: Never true    Within the past 12 months, the food you bought just didn't last and you didn't have money to get more. : Never true  Transportation Needs: No Transportation Needs (03/16/2023)   Received from [facility]    In the past 12 months, has lack of reliable transportation kept you from medical appointments, meetings, work or from getting things needed for daily living? : No  Physical Activity: Inactive (11/03/2023)   Exercise Vital Sign    Days of Exercise per Week: 0 days    Minutes of Exercise per Session: 0 min  Stress: Stress Concern Present (11/03/2023)   [employer] of Occupational Health - Occupational Stress Questionnaire    Feeling of Stress: To some extent  Social Connections: Unknown (11/03/2023)   Social Connection and Isolation Panel    Frequency of Communication with Friends and Family: Three times a week    Frequency of Social Gatherings with Friends and Family: Never    Attends Religious Services: Never    Database Administrator or Organizations: No    Attends Engineer, Structural: Not on file    Marital Status: Not on file  Intimate Partner Violence: Not At Risk (10/26/2023)   Epic    Fear of Current or Ex-Partner: No    Emotionally Abused: No    Physically Abused: No    Sexually Abused: No  Depression (PHQ2-9): Low Risk (11/03/2023)   Depression (PHQ2-9)    PHQ-2 Score: 0  Alcohol Screen: Low Risk (03/02/2021)   Alcohol Screen    Last Alcohol Screening Score (AUDIT): 2  Housing: Low Risk (03/16/2023)   Received from [facility]   Epic    What is your living situation today?: I have a steady place to live     Think about the place you live. Do you have problems with any of the following? Choose all that apply:: None/None on this list  Utilities: Low Risk (03/16/2023)   Received from [facility]   Utilities    In the past 12 months has the electric, gas, oil, or water company threatened to shut off services in your home? : No  Health Literacy: Adequate Health Literacy (11/03/2023)   B1300 Health Literacy    Frequency of need for help with medical instructions: Never    Outpatient Medications Prior to Visit  Medication Sig Dispense Refill   ACCU-CHEK GUIDE test strip USE TO TEST UP TO 4 TIMES  DAILY AS DIRECTED 100 strip 2   albuterol  (VENTOLIN  HFA) 108 (90 Base) MCG/ACT inhaler TAKE 2 PUFFS BY MOUTH EVERY 6 HOURS AS NEEDED FOR WHEEZE OR SHORTNESS OF BREATH 8.5 each 2   aspirin 81 MG chewable tablet Chew 81 mg by mouth daily.     atorvastatin  (LIPITOR) 40 MG tablet TAKE 1 TABLET BY MOUTH EVERY DAY 90 tablet 1   BREZTRI  AEROSPHERE 160-9-4.8 MCG/ACT AERO INHALE 2 PUFFS INTO THE LUNGS IN THE MORNING AND AT BEDTIME. 10.7 each 11   fluticasone  (FLONASE ) 50 MCG/ACT nasal spray SPRAY 2 SPRAYS INTO EACH NOSTRIL EVERY DAY 48 mL 2   ibuprofen  (ADVIL ) 800 MG tablet TAKE 1 TABLET BY MOUTH EVERY 8 HOURS AS NEEDED 90 tablet 0   olmesartan  (BENICAR ) 20 MG tablet TAKE 1 TABLET BY MOUTH EVERY DAY 90 tablet 1   No facility-administered medications prior to visit.    Allergies[1]  Review of Systems  Constitutional:  Negative for appetite change, fatigue and fever.  HENT:  Negative for congestion, ear pain, sinus pressure and sore throat.   Respiratory:  Negative for cough, chest tightness, shortness of breath and wheezing.   Cardiovascular:  Negative for chest pain and palpitations.  Gastrointestinal:  Negative for abdominal pain, constipation, diarrhea, nausea and vomiting.  Genitourinary:  Negative for dysuria and hematuria.  Musculoskeletal:  Negative for arthralgias, back pain, joint swelling and  myalgias.  Skin:  Negative for rash.  Neurological:  Negative for dizziness, weakness and headaches.  Psychiatric/Behavioral:  Negative for dysphoric mood. The patient is not nervous/anxious.        Objective:        11/03/2023    9:13 AM 10/26/2023   10:49 AM 06/15/2023   10:19 AM  Vitals with BMI  Height 5' 9 5' 9 5' 9  Weight 227 lbs 225 lbs 227 lbs 13 oz  BMI 33.51 33.21 33.62  Systolic 138 136 867  Diastolic 76 70 82  Pulse 86 76 75    No data found.   Physical Exam Vitals reviewed.  Constitutional:      Appearance: Normal appearance.  Neck:     Vascular: No carotid bruit.  Cardiovascular:     Rate and Rhythm: Normal rate and regular rhythm.     Heart sounds: Normal heart sounds.  Pulmonary:     Effort: Pulmonary effort is normal.     Breath sounds: Wheezing present. No rhonchi or rales.  Abdominal:     General: Bowel sounds are normal.     Palpations: Abdomen is soft.     Tenderness: There is no abdominal tenderness.  Neurological:     Mental Status: She is alert and oriented to person, place, and time.  Psychiatric:        Mood and Affect: Mood normal.        Behavior: Behavior normal.     Health Maintenance Due  Topic Date Due   FOOT EXAM  03/04/2023   Fecal DNA (Cologuard)  12/31/2023    There are no preventive care reminders to display for this patient.   Lab Results  Component Value Date   TSH 0.604 10/26/2023   Lab Results  Component Value Date   WBC 9.7 10/26/2023   HGB 14.3 10/26/2023   HCT 42.3 10/26/2023   MCV 100 (H) 10/26/2023   PLT 313 10/26/2023   Lab Results  Component Value Date   NA 142 10/26/2023   K 4.4 10/26/2023   CO2 21 10/26/2023  GLUCOSE 101 (H) 10/26/2023   BUN 13 10/26/2023   CREATININE 0.58 10/26/2023   BILITOT 0.6 10/26/2023   ALKPHOS 113 10/26/2023   AST 21 10/26/2023   ALT 23 10/26/2023   PROT 6.7 10/26/2023   ALBUMIN 4.6 10/26/2023   CALCIUM  9.9 10/26/2023   EGFR 102 10/26/2023   Lab  Results  Component Value Date   CHOL 147 10/26/2023   Lab Results  Component Value Date   HDL 73 10/26/2023   Lab Results  Component Value Date   LDLCALC 59 10/26/2023   Lab Results  Component Value Date   TRIG 75 10/26/2023   Lab Results  Component Value Date   CHOLHDL 2.0 10/26/2023   Lab Results  Component Value Date   HGBA1C 5.7 (H) 10/26/2023        Results for orders placed or performed in visit on 10/26/23  CK   Collection Time: 10/26/23 11:27 AM  Result Value Ref Range   Total CK 19 (L) 32 - 182 U/L  Magnesium   Collection Time: 10/26/23 11:27 AM  Result Value Ref Range   Magnesium 2.2 1.6 - 2.3 mg/dL  CBC with Differential/Platelet   Collection Time: 10/26/23 11:27 AM  Result Value Ref Range   WBC 9.7 3.4 - 10.8 x10E3/uL   RBC 4.25 3.77 - 5.28 x10E6/uL   Hemoglobin 14.3 11.1 - 15.9 g/dL   Hematocrit 57.6 65.9 - 46.6 %   MCV 100 (H) 79 - 97 fL   MCH 33.6 (H) 26.6 - 33.0 pg   MCHC 33.8 31.5 - 35.7 g/dL   RDW 87.6 88.2 - 84.5 %   Platelets 313 150 - 450 x10E3/uL   Neutrophils 53 Not Estab. %   Lymphs 38 Not Estab. %   Monocytes 6 Not Estab. %   Eos 2 Not Estab. %   Basos 1 Not Estab. %   Neutrophils Absolute 5.2 1.4 - 7.0 x10E3/uL   Lymphocytes Absolute 3.7 (H) 0.7 - 3.1 x10E3/uL   Monocytes Absolute 0.5 0.1 - 0.9 x10E3/uL   EOS (ABSOLUTE) 0.2 0.0 - 0.4 x10E3/uL   Basophils Absolute 0.1 0.0 - 0.2 x10E3/uL   Immature Granulocytes 0 Not Estab. %   Immature Grans (Abs) 0.0 0.0 - 0.1 x10E3/uL  Comprehensive metabolic panel with GFR   Collection Time: 10/26/23 11:27 AM  Result Value Ref Range   Glucose 101 (H) 70 - 99 mg/dL   BUN 13 8 - 27 mg/dL   Creatinine, Ser 9.41 0.57 - 1.00 mg/dL   eGFR 897 >40 fO/fpw/8.26   BUN/Creatinine Ratio 22 12 - 28   Sodium 142 134 - 144 mmol/L   Potassium 4.4 3.5 - 5.2 mmol/L   Chloride 105 96 - 106 mmol/L   CO2 21 20 - 29 mmol/L   Calcium  9.9 8.7 - 10.3 mg/dL   Total Protein 6.7 6.0 - 8.5 g/dL   Albumin 4.6  3.9 - 4.9 g/dL   Globulin, Total 2.1 1.5 - 4.5 g/dL   Bilirubin Total 0.6 0.0 - 1.2 mg/dL   Alkaline Phosphatase 113 49 - 135 IU/L   AST 21 0 - 40 IU/L   ALT 23 0 - 32 IU/L  Hemoglobin A1c   Collection Time: 10/26/23 11:27 AM  Result Value Ref Range   Hgb A1c MFr Bld 5.7 (H) 4.8 - 5.6 %   Est. average glucose Bld gHb Est-mCnc 117 mg/dL  Lipid panel   Collection Time: 10/26/23 11:27 AM  Result Value Ref Range   Cholesterol,  Total 147 100 - 199 mg/dL   Triglycerides 75 0 - 149 mg/dL   HDL 73 >60 mg/dL   VLDL Cholesterol Cal 15 5 - 40 mg/dL   LDL Chol Calc (NIH) 59 0 - 99 mg/dL   Chol/HDL Ratio 2.0 0.0 - 4.4 ratio  TSH   Collection Time: 10/26/23 11:27 AM  Result Value Ref Range   TSH 0.604 0.450 - 4.500 uIU/mL     Assessment & Plan:   Assessment & Plan COPD exacerbation (HCC) COPD exacerbation Acute exacerbation with airway inflammation. Negative COVID and flu tests. Risk of pneumonia if symptoms worsen. - Prescribed doxycycline  100 mg orally twice a day for 7 days. - Refilled albuterol  inhaler with 18 gram size. - Advised albuterol  use as needed for wheezing. - Instructed to monitor for fever or worsening symptoms, report if pneumonia develops. - Discussed but declined steroid options. Orders:   doxycycline  (VIBRA -TABS) 100 MG tablet; Take 1 tablet (100 mg total) by mouth 2 (two) times daily.   albuterol  (VENTOLIN  HFA) 108 (90 Base) MCG/ACT inhaler; Inhale 2 puffs into the lungs every 4 (four) hours as needed for wheezing or shortness of breath.  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 Pain exacerbated by standing, relieved by sitting. - Ordered physical therapy for core, low back strengthening, and glute stretching. - Provided work restriction note to avoid standing over 15 minutes without  sitting. - Advised to monitor symptoms, consider further evaluation if pain persists or worsens before March appointment with Dr. Sherre.       There is no height or weight on file to calculate BMI..   No orders of the defined types were placed in this encounter.   No orders of the defined types were placed in this encounter.    Follow-up: No follow-ups on file.  An After Visit Summary was printed and given to the patient.    I,Lauren M Auman,acting as a neurosurgeon for [employer]have documented all relevant documentation on the behalf of Nola Angles, PA,as directed by  Nola Angles, PA while in the presence of Nola Angles, [place].    Nola Angles, [place] [facility] [phone]     [1]  Allergies Allergen Reactions   Lisinopril Diarrhea   Meclizine  Other (See Comments)    MADE VERY SLEEPY   Septra [Sulfamethoxazole-Trimethoprim] Hives   Sulfa Antibiotics Hives   "

## 2024-01-12 NOTE — Telephone Encounter (Signed)
 FYI Only or Action Required?: FYI only for provider: appointment scheduled on 01/12/24.  Patient was last seen in primary care on 11/03/2023 by Sherre Clapper, MD.  Called Nurse Triage reporting Nasal Congestion.  Symptoms began several days ago.  Interventions attempted: Prescription medications: Albuterol  inhaler and beztri and flonase .  Symptoms are: gradually worsening.  Triage Disposition: See PCP When Office is Open (Within 3 Days)  Patient/caregiver understands and will follow disposition?: Yes    Saturday onset of nasal congestion that has now traveled down into chest. Moderate SOB at night d/t congestion. Has hx of COPD and OSA, used albuterol  inhaler which helped. No SOB during the day. No fever. Moderate cough with green mucus, no blood. Scheduled appt with different provider at home office today per pt preference. Advised UC or ED for worsening symptoms.     Copied from CRM #[id number]. Topic: Clinical - Red Word Triage >> Jan 12, 2024  9:54 AM Tinnie BROCKS wrote: Red Word that prompted transfer to Nurse Triage: Green phlegm, respiratory infection per pt Reason for Disposition  Lots of coughing  Answer Assessment - Initial Assessment Questions 1. LOCATION: Where does it hurt?      No pain  2. ONSET: When did the sinus pain start?  (e.g., hours, days)      Saturday  3. SEVERITY: How bad is the pain?   (Scale 0-10; or none, mild, moderate or severe)     No pain  4. RECURRENT SYMPTOM: Have you ever had sinus problems before? If Yes, ask: When was the last time? and What happened that time?      Yes. Several years ago. Had to take abxs and albuterol .  5. NASAL CONGESTION: Is the nose blocked? If Yes, ask: Can you open it or must you breathe through your mouth?     Able to breathe through her nose  6. NASAL DISCHARGE: Do you have discharge from your nose? If so ask, What color?     Coughing up green mucus  7. FEVER: Do you have a fever? If Yes, ask:  What is it, how was it measured, and when did it start?      Denies  8. OTHER SYMPTOMS: Do you have any other symptoms? (e.g., sore throat, cough, earache, difficulty breathing)     Moderate cough  Protocols used: Sinus Pain or Congestion-A-AH

## 2024-03-08 ENCOUNTER — Ambulatory Visit: Admitting: Family Medicine
